# Patient Record
Sex: Female | Born: 1945
Health system: Southern US, Community
[De-identification: ages and names within clinical notes are randomized; demographics above are authoritative.]

## PROBLEM LIST (undated history)

## (undated) DIAGNOSIS — E785 Hyperlipidemia, unspecified: Secondary | ICD-10-CM

## (undated) DIAGNOSIS — E049 Nontoxic goiter, unspecified: Secondary | ICD-10-CM

## (undated) DIAGNOSIS — N189 Chronic kidney disease, unspecified: Secondary | ICD-10-CM

## (undated) DIAGNOSIS — I214 Non-ST elevation (NSTEMI) myocardial infarction: Secondary | ICD-10-CM

## (undated) DIAGNOSIS — M8580 Other specified disorders of bone density and structure, unspecified site: Secondary | ICD-10-CM

## (undated) DIAGNOSIS — E039 Hypothyroidism, unspecified: Secondary | ICD-10-CM

## (undated) DIAGNOSIS — D649 Anemia, unspecified: Secondary | ICD-10-CM

## (undated) DIAGNOSIS — M419 Scoliosis, unspecified: Secondary | ICD-10-CM

## (undated) DIAGNOSIS — M349 Systemic sclerosis, unspecified: Secondary | ICD-10-CM

## (undated) DIAGNOSIS — I509 Heart failure, unspecified: Secondary | ICD-10-CM

## (undated) DIAGNOSIS — I10 Essential (primary) hypertension: Secondary | ICD-10-CM

## (undated) DIAGNOSIS — I5181 Takotsubo syndrome: Secondary | ICD-10-CM

## (undated) DIAGNOSIS — C801 Malignant (primary) neoplasm, unspecified: Secondary | ICD-10-CM

## (undated) DIAGNOSIS — I469 Cardiac arrest, cause unspecified: Secondary | ICD-10-CM

## (undated) HISTORY — DX: Hyperlipidemia, unspecified: E78.5

## (undated) HISTORY — DX: Non-ST elevation (NSTEMI) myocardial infarction: I21.4

## (undated) HISTORY — DX: Essential (primary) hypertension: I10

## (undated) HISTORY — DX: Nontoxic goiter, unspecified: E04.9

## (undated) HISTORY — DX: Malignant (primary) neoplasm, unspecified: C80.1

## (undated) HISTORY — PX: THYROIDECTOMY: SHX17

## (undated) HISTORY — DX: Takotsubo syndrome: I51.81

## (undated) HISTORY — PX: BACK SURGERY: SHX140

## (undated) HISTORY — DX: Systemic sclerosis, unspecified: M34.9

## (undated) HISTORY — PX: TONSILLECTOMY: SUR1361

## (undated) HISTORY — PX: OTHER SURGICAL HISTORY: SHX169

---

## 1999-06-30 ENCOUNTER — Encounter: Admission: RE | Admit: 1999-06-30 | Discharge: 1999-06-30 | Payer: Self-pay | Admitting: Obstetrics and Gynecology

## 1999-06-30 ENCOUNTER — Encounter: Payer: Self-pay | Admitting: Obstetrics and Gynecology

## 2000-08-17 ENCOUNTER — Encounter: Payer: Self-pay | Admitting: Obstetrics and Gynecology

## 2000-08-17 ENCOUNTER — Encounter: Admission: RE | Admit: 2000-08-17 | Discharge: 2000-08-17 | Payer: Self-pay | Admitting: Obstetrics and Gynecology

## 2001-08-14 ENCOUNTER — Other Ambulatory Visit: Admission: RE | Admit: 2001-08-14 | Discharge: 2001-08-14 | Payer: Self-pay | Admitting: Obstetrics and Gynecology

## 2001-10-05 ENCOUNTER — Encounter: Admission: RE | Admit: 2001-10-05 | Discharge: 2001-10-05 | Payer: Self-pay | Admitting: Obstetrics and Gynecology

## 2001-10-05 ENCOUNTER — Encounter: Payer: Self-pay | Admitting: Obstetrics and Gynecology

## 2003-03-05 ENCOUNTER — Other Ambulatory Visit: Admission: RE | Admit: 2003-03-05 | Discharge: 2003-03-05 | Payer: Self-pay | Admitting: Gynecology

## 2003-04-02 ENCOUNTER — Encounter: Admission: RE | Admit: 2003-04-02 | Discharge: 2003-04-02 | Payer: Self-pay | Admitting: Gynecology

## 2004-03-04 ENCOUNTER — Other Ambulatory Visit: Admission: RE | Admit: 2004-03-04 | Discharge: 2004-03-04 | Payer: Self-pay | Admitting: Gynecology

## 2004-04-09 ENCOUNTER — Encounter: Admission: RE | Admit: 2004-04-09 | Discharge: 2004-04-09 | Payer: Self-pay | Admitting: Family Medicine

## 2005-04-12 ENCOUNTER — Encounter: Admission: RE | Admit: 2005-04-12 | Discharge: 2005-04-12 | Payer: Self-pay | Admitting: Gynecology

## 2005-04-12 ENCOUNTER — Other Ambulatory Visit: Admission: RE | Admit: 2005-04-12 | Discharge: 2005-04-12 | Payer: Self-pay | Admitting: Gynecology

## 2006-02-09 ENCOUNTER — Ambulatory Visit: Payer: Self-pay | Admitting: Gastroenterology

## 2006-03-07 ENCOUNTER — Ambulatory Visit: Payer: Self-pay | Admitting: Gastroenterology

## 2006-04-12 ENCOUNTER — Other Ambulatory Visit: Admission: RE | Admit: 2006-04-12 | Discharge: 2006-04-12 | Payer: Self-pay | Admitting: Gynecology

## 2006-04-13 ENCOUNTER — Encounter: Admission: RE | Admit: 2006-04-13 | Discharge: 2006-04-13 | Payer: Self-pay | Admitting: Family Medicine

## 2007-04-16 ENCOUNTER — Other Ambulatory Visit: Admission: RE | Admit: 2007-04-16 | Discharge: 2007-04-16 | Payer: Self-pay | Admitting: Gynecology

## 2009-06-23 ENCOUNTER — Encounter: Admission: RE | Admit: 2009-06-23 | Discharge: 2009-06-23 | Payer: Self-pay | Admitting: Family Medicine

## 2009-06-25 ENCOUNTER — Encounter: Admission: RE | Admit: 2009-06-25 | Discharge: 2009-06-25 | Payer: Self-pay | Admitting: Family Medicine

## 2010-05-29 ENCOUNTER — Other Ambulatory Visit: Payer: Self-pay | Admitting: Family Medicine

## 2010-05-29 DIAGNOSIS — Z1231 Encounter for screening mammogram for malignant neoplasm of breast: Secondary | ICD-10-CM

## 2010-06-15 ENCOUNTER — Other Ambulatory Visit: Payer: Self-pay | Admitting: Family Medicine

## 2010-06-15 DIAGNOSIS — E049 Nontoxic goiter, unspecified: Secondary | ICD-10-CM

## 2010-06-21 ENCOUNTER — Other Ambulatory Visit: Payer: Self-pay

## 2010-06-21 ENCOUNTER — Ambulatory Visit
Admission: RE | Admit: 2010-06-21 | Discharge: 2010-06-21 | Disposition: A | Discharge status: Home / Self Care | Payer: PRIVATE HEALTH INSURANCE | Source: Ambulatory Visit | Attending: Family Medicine | Admitting: Family Medicine

## 2010-06-21 DIAGNOSIS — E049 Nontoxic goiter, unspecified: Secondary | ICD-10-CM

## 2010-07-28 ENCOUNTER — Other Ambulatory Visit: Payer: Self-pay | Admitting: Endocrinology

## 2010-07-28 DIAGNOSIS — E042 Nontoxic multinodular goiter: Secondary | ICD-10-CM

## 2010-07-29 ENCOUNTER — Ambulatory Visit
Admission: RE | Admit: 2010-07-29 | Discharge: 2010-07-29 | Disposition: A | Discharge status: Home / Self Care | Payer: PRIVATE HEALTH INSURANCE | Source: Ambulatory Visit | Attending: Family Medicine | Admitting: Family Medicine

## 2010-07-29 DIAGNOSIS — Z1231 Encounter for screening mammogram for malignant neoplasm of breast: Secondary | ICD-10-CM

## 2010-08-04 ENCOUNTER — Other Ambulatory Visit (HOSPITAL_COMMUNITY)
Admission: RE | Admit: 2010-08-04 | Discharge: 2010-08-04 | Disposition: A | Discharge status: Home / Self Care | Payer: PRIVATE HEALTH INSURANCE | Source: Ambulatory Visit | Attending: Interventional Radiology | Admitting: Interventional Radiology

## 2010-08-04 ENCOUNTER — Other Ambulatory Visit: Payer: Self-pay | Admitting: Interventional Radiology

## 2010-08-04 ENCOUNTER — Ambulatory Visit
Admission: RE | Admit: 2010-08-04 | Discharge: 2010-08-04 | Disposition: A | Discharge status: Home / Self Care | Payer: PRIVATE HEALTH INSURANCE | Source: Ambulatory Visit | Attending: Endocrinology | Admitting: Endocrinology

## 2010-08-04 DIAGNOSIS — E042 Nontoxic multinodular goiter: Secondary | ICD-10-CM

## 2010-08-04 DIAGNOSIS — E049 Nontoxic goiter, unspecified: Secondary | ICD-10-CM | POA: Insufficient documentation

## 2010-08-24 ENCOUNTER — Ambulatory Visit: Payer: Self-pay

## 2010-09-15 ENCOUNTER — Other Ambulatory Visit: Payer: Self-pay | Admitting: Endocrinology

## 2010-09-15 DIAGNOSIS — E049 Nontoxic goiter, unspecified: Secondary | ICD-10-CM

## 2010-09-16 ENCOUNTER — Other Ambulatory Visit: Payer: Self-pay | Admitting: Gynecology

## 2010-09-24 ENCOUNTER — Ambulatory Visit
Admission: RE | Admit: 2010-09-24 | Discharge: 2010-09-24 | Disposition: A | Discharge status: Home / Self Care | Payer: PRIVATE HEALTH INSURANCE | Source: Ambulatory Visit | Attending: Endocrinology | Admitting: Endocrinology

## 2010-09-24 DIAGNOSIS — E049 Nontoxic goiter, unspecified: Secondary | ICD-10-CM

## 2010-10-26 ENCOUNTER — Ambulatory Visit (HOSPITAL_COMMUNITY): Payer: PRIVATE HEALTH INSURANCE

## 2010-10-28 ENCOUNTER — Ambulatory Visit (HOSPITAL_COMMUNITY)
Admission: RE | Admit: 2010-10-28 | Discharge: 2010-10-28 | Disposition: A | Discharge status: Home / Self Care | Payer: PRIVATE HEALTH INSURANCE | Source: Ambulatory Visit | Attending: Internal Medicine | Admitting: Internal Medicine

## 2010-10-28 ENCOUNTER — Encounter (HOSPITAL_COMMUNITY): Payer: PRIVATE HEALTH INSURANCE

## 2010-10-28 DIAGNOSIS — M349 Systemic sclerosis, unspecified: Secondary | ICD-10-CM | POA: Insufficient documentation

## 2010-10-28 DIAGNOSIS — I10 Essential (primary) hypertension: Secondary | ICD-10-CM | POA: Insufficient documentation

## 2010-10-28 DIAGNOSIS — E049 Nontoxic goiter, unspecified: Secondary | ICD-10-CM | POA: Insufficient documentation

## 2010-10-28 DIAGNOSIS — E785 Hyperlipidemia, unspecified: Secondary | ICD-10-CM | POA: Insufficient documentation

## 2010-10-28 HISTORY — PX: TRANSTHORACIC ECHOCARDIOGRAM: SHX275

## 2010-11-02 ENCOUNTER — Other Ambulatory Visit (HOSPITAL_COMMUNITY): Payer: PRIVATE HEALTH INSURANCE

## 2010-11-02 ENCOUNTER — Encounter (HOSPITAL_COMMUNITY): Payer: PRIVATE HEALTH INSURANCE

## 2010-11-08 ENCOUNTER — Encounter (INDEPENDENT_AMBULATORY_CARE_PROVIDER_SITE_OTHER): Payer: Self-pay | Admitting: Surgery

## 2010-11-08 ENCOUNTER — Ambulatory Visit (INDEPENDENT_AMBULATORY_CARE_PROVIDER_SITE_OTHER): Payer: PRIVATE HEALTH INSURANCE | Admitting: Surgery

## 2010-11-08 DIAGNOSIS — E042 Nontoxic multinodular goiter: Secondary | ICD-10-CM | POA: Insufficient documentation

## 2010-11-08 DIAGNOSIS — E049 Nontoxic goiter, unspecified: Secondary | ICD-10-CM | POA: Insufficient documentation

## 2010-11-08 NOTE — Progress Notes (Signed)
HISTORY: Patient is a 65 year old white female. She has had a history of multinodular thyroid goiter for 6 years. It has gradually increased in size. She stopped taking her thyroid hormone suppression of therapy in March 2012. She has had sequential ultrasound scanning showing gradual enlargement of the multinodular goiter. There is a suspicious nodule measuring 2.2 cm in the upper left lobe with calcifications. Previous fine-needle aspiration biopsy has been indeterminate.  Patient has undergone radioactive iodine treatment for hyperthyroidism in 1999 in [place].  Patient has had no prior head or neck surgery. There is no other family history of endocrinopathy. Specifically there is no history of thyroid cancer.  Past Medical History  Diagnosis Date  . Diabetes mellitus   . Thyroid goiter   . Hypertension   . Hyperlipidemia   . Scleroderma    No current outpatient prescriptions on file prior to visit.   Allergies  Allergen Reactions  . Sulfa Drugs Cross Reactors      PERTINENT REVIEW OF SYSTEMS: Patient denies any significant compressive symptoms. She does have discomfort with tightfitting clothing or physical examination. Otherwise she does not denies dysphagia, dyspnea, or globus sensation.   EXAM: HEENT shows her to be normocephalic. Sclerae clear. Pupils equal and reactive. Dentition fair. Mucous membranes are moist. Voice is normal. Neck shows an obvious multinodular thyroid goiter greater on the left than on the right. Palpation reveals a firm large nodules bilaterally and involving the isthmus. There is minor discomfort with compression. There is no anterior or posterior cervical lymphadenopathy appreciated. There are no supraclavicular masses. Chest is clear to auscultation bilaterally without rales rhonchi or wheeze Cardiac exam shows regular rate and rhythm without murmur peripheral pulses are felt Extremities are nontender without edema Neurologically the  patient is alert and oriented. There are no focal deficits. There is no sign of tremor.   IMPRESSION: Multinodular thyroid goiter with mild compressive symptoms. Suspicious nodule left thyroid lobe with calcifications, history of inadequate fine-needle aspiration sampling. History of radioactive iodine treatment for hyperthyroidism.   PLAN: I had a lengthy discussion with the patient regarding options for management. Certainly she could continue with observation and undergo fine-needle aspiration biopsy. At this point however the patient is interested in proceeding with thyroidectomy. We discussed the procedure of total thyroidectomy at length. We discussed potential complications including injury to recurrent laryngeal nerves and parathyroid glands. We discussed the need for lifelong thyroid hormone replacement. We discussed the risk of malignancy has been approximately 15%. We discussed the potential need for radioactive iodine treatment postoperatively. The patient understands. We discussed the hospital stay to be anticipated. We discussed her recovery and return to work.  Patient would like to proceed with total thyroidectomy. We will make arrangements for surgery in the near future.

## 2010-12-08 DIAGNOSIS — I5181 Takotsubo syndrome: Secondary | ICD-10-CM

## 2010-12-08 DIAGNOSIS — I214 Non-ST elevation (NSTEMI) myocardial infarction: Secondary | ICD-10-CM

## 2010-12-08 HISTORY — DX: Non-ST elevation (NSTEMI) myocardial infarction: I21.4

## 2010-12-08 HISTORY — DX: Takotsubo syndrome: I51.81

## 2010-12-21 ENCOUNTER — Other Ambulatory Visit (INDEPENDENT_AMBULATORY_CARE_PROVIDER_SITE_OTHER): Payer: Self-pay | Admitting: Surgery

## 2010-12-21 ENCOUNTER — Encounter (HOSPITAL_COMMUNITY): Payer: PRIVATE HEALTH INSURANCE

## 2010-12-21 ENCOUNTER — Ambulatory Visit (HOSPITAL_COMMUNITY)
Admission: RE | Admit: 2010-12-21 | Discharge: 2010-12-21 | Disposition: A | Discharge status: Home / Self Care | Payer: PRIVATE HEALTH INSURANCE | Source: Ambulatory Visit | Attending: Surgery | Admitting: Surgery

## 2010-12-21 DIAGNOSIS — Z01812 Encounter for preprocedural laboratory examination: Secondary | ICD-10-CM | POA: Insufficient documentation

## 2010-12-21 DIAGNOSIS — Z01810 Encounter for preprocedural cardiovascular examination: Secondary | ICD-10-CM | POA: Insufficient documentation

## 2010-12-21 DIAGNOSIS — R059 Cough, unspecified: Secondary | ICD-10-CM | POA: Insufficient documentation

## 2010-12-21 DIAGNOSIS — E049 Nontoxic goiter, unspecified: Secondary | ICD-10-CM

## 2010-12-21 DIAGNOSIS — R05 Cough: Secondary | ICD-10-CM | POA: Insufficient documentation

## 2010-12-21 DIAGNOSIS — Z01818 Encounter for other preprocedural examination: Secondary | ICD-10-CM | POA: Insufficient documentation

## 2010-12-21 DIAGNOSIS — E041 Nontoxic single thyroid nodule: Secondary | ICD-10-CM | POA: Insufficient documentation

## 2010-12-21 DIAGNOSIS — I10 Essential (primary) hypertension: Secondary | ICD-10-CM | POA: Insufficient documentation

## 2010-12-21 LAB — SURGICAL PCR SCREEN
MRSA, PCR: NEGATIVE
Staphylococcus aureus: NEGATIVE

## 2010-12-21 LAB — BASIC METABOLIC PANEL
BUN: 10 mg/dL (ref 6–23)
CO2: 29 mEq/L (ref 19–32)
Calcium: 9.9 mg/dL (ref 8.4–10.5)
Chloride: 102 mEq/L (ref 96–112)
Creatinine, Ser: 0.77 mg/dL (ref 0.50–1.10)
GFR calc Af Amer: >60 mL/min (ref >60)
GFR calc non Af Amer: >60 mL/min (ref >60)
Glucose, Bld: 105 mg/dL — ABNORMAL HIGH (ref 70–99)
Potassium: 4.6 mEq/L (ref 3.5–5.1)
Sodium: 140 mEq/L (ref 135–145)

## 2010-12-21 LAB — URINALYSIS, ROUTINE W REFLEX MICROSCOPIC
Bilirubin Urine: NEGATIVE
Glucose, UA: NEGATIVE mg/dL
Hgb urine dipstick: NEGATIVE
Ketones, ur: NEGATIVE mg/dL
Leukocytes, UA: NEGATIVE
Nitrite: NEGATIVE
Protein, ur: NEGATIVE mg/dL
Specific Gravity, Urine: 1.006 (ref 1.005–1.030)
Urobilinogen, UA: 0.2 mg/dL (ref 0.0–1.0)
pH: 6.5 (ref 5.0–8.0)

## 2010-12-21 LAB — DIFFERENTIAL
Basophils Absolute: 0 10*3/uL (ref 0.0–0.1)
Basophils Relative: 0 % (ref 0–1)
Eosinophils Absolute: 0.1 10*3/uL (ref 0.0–0.7)
Eosinophils Relative: 1 % (ref 0–5)
Lymphocytes Relative: 9 % — ABNORMAL LOW (ref 12–46)
Lymphs Abs: 0.9 10*3/uL (ref 0.7–4.0)
Monocytes Absolute: 0.7 10*3/uL (ref 0.1–1.0)
Monocytes Relative: 8 % (ref 3–12)
Neutro Abs: 7.8 10*3/uL — ABNORMAL HIGH (ref 1.7–7.7)
Neutrophils Relative %: 82 % — ABNORMAL HIGH (ref 43–77)

## 2010-12-21 LAB — CBC
HCT: 37.9 % (ref 36.0–46.0)
Hemoglobin: 12.2 g/dL (ref 12.0–15.0)
MCH: 29.8 pg (ref 26.0–34.0)
MCHC: 32.2 g/dL (ref 30.0–36.0)
MCV: 92.4 fL (ref 78.0–100.0)
Platelets: 321 10*3/uL (ref 150–400)
RBC: 4.1 MIL/uL (ref 3.87–5.11)
RDW: 13.3 % (ref 11.5–15.5)
WBC: 9.5 10*3/uL (ref 4.0–10.5)

## 2010-12-21 LAB — PROTIME-INR
INR: 0.94 (ref 0.00–1.49)
Prothrombin Time: 12.8 seconds (ref 11.6–15.2)

## 2010-12-22 ENCOUNTER — Telehealth (INDEPENDENT_AMBULATORY_CARE_PROVIDER_SITE_OTHER): Payer: Self-pay | Admitting: General Surgery

## 2010-12-23 ENCOUNTER — Telehealth (INDEPENDENT_AMBULATORY_CARE_PROVIDER_SITE_OTHER): Payer: Self-pay

## 2010-12-23 NOTE — Telephone Encounter (Signed)
Patient on biaxin 500mg  for URI- started 12/21/10 by [facility].  Ok, per Dr. Gerrit Friends to proceed with surgery. RMP

## 2010-12-28 ENCOUNTER — Other Ambulatory Visit (INDEPENDENT_AMBULATORY_CARE_PROVIDER_SITE_OTHER): Payer: Self-pay | Admitting: Surgery

## 2010-12-28 ENCOUNTER — Inpatient Hospital Stay (HOSPITAL_COMMUNITY)
Admission: RE | Admit: 2010-12-28 | Discharge: 2011-01-01 | DRG: 625 | Disposition: A | Discharge status: Home / Self Care | Payer: PRIVATE HEALTH INSURANCE | Source: Ambulatory Visit | Attending: Surgery | Admitting: Surgery

## 2010-12-28 DIAGNOSIS — J811 Chronic pulmonary edema: Secondary | ICD-10-CM | POA: Diagnosis not present

## 2010-12-28 DIAGNOSIS — I214 Non-ST elevation (NSTEMI) myocardial infarction: Secondary | ICD-10-CM | POA: Diagnosis not present

## 2010-12-28 DIAGNOSIS — J9600 Acute respiratory failure, unspecified whether with hypoxia or hypercapnia: Secondary | ICD-10-CM | POA: Diagnosis not present

## 2010-12-28 DIAGNOSIS — Z01818 Encounter for other preprocedural examination: Secondary | ICD-10-CM

## 2010-12-28 DIAGNOSIS — C73 Malignant neoplasm of thyroid gland: Secondary | ICD-10-CM

## 2010-12-28 DIAGNOSIS — E785 Hyperlipidemia, unspecified: Secondary | ICD-10-CM | POA: Diagnosis present

## 2010-12-28 DIAGNOSIS — Z01812 Encounter for preprocedural laboratory examination: Secondary | ICD-10-CM

## 2010-12-28 DIAGNOSIS — R748 Abnormal levels of other serum enzymes: Secondary | ICD-10-CM | POA: Diagnosis not present

## 2010-12-28 DIAGNOSIS — E042 Nontoxic multinodular goiter: Principal | ICD-10-CM | POA: Diagnosis present

## 2010-12-28 DIAGNOSIS — R9431 Abnormal electrocardiogram [ECG] [EKG]: Secondary | ICD-10-CM | POA: Diagnosis not present

## 2010-12-28 DIAGNOSIS — Z888 Allergy status to other drugs, medicaments and biological substances status: Secondary | ICD-10-CM

## 2010-12-28 DIAGNOSIS — I10 Essential (primary) hypertension: Secondary | ICD-10-CM | POA: Diagnosis present

## 2010-12-28 DIAGNOSIS — E119 Type 2 diabetes mellitus without complications: Secondary | ICD-10-CM | POA: Diagnosis present

## 2010-12-28 DIAGNOSIS — Z01810 Encounter for preprocedural cardiovascular examination: Secondary | ICD-10-CM

## 2010-12-28 DIAGNOSIS — E871 Hypo-osmolality and hyponatremia: Secondary | ICD-10-CM | POA: Diagnosis not present

## 2010-12-28 LAB — CALCIUM: Calcium: 8 mg/dL — ABNORMAL LOW (ref 8.4–10.5)

## 2010-12-29 ENCOUNTER — Observation Stay (HOSPITAL_COMMUNITY): Payer: PRIVATE HEALTH INSURANCE

## 2010-12-29 DIAGNOSIS — J9600 Acute respiratory failure, unspecified whether with hypoxia or hypercapnia: Secondary | ICD-10-CM

## 2010-12-29 DIAGNOSIS — J810 Acute pulmonary edema: Secondary | ICD-10-CM

## 2010-12-29 DIAGNOSIS — R079 Chest pain, unspecified: Secondary | ICD-10-CM

## 2010-12-29 LAB — COMPREHENSIVE METABOLIC PANEL
ALT: 26 U/L (ref 0–35)
AST: 38 U/L — ABNORMAL HIGH (ref 0–37)
Albumin: 3.2 g/dL — ABNORMAL LOW (ref 3.5–5.2)
Alkaline Phosphatase: 61 U/L (ref 39–117)
BUN: 13 mg/dL (ref 6–23)
CO2: 29 mEq/L (ref 19–32)
Calcium: 8.9 mg/dL (ref 8.4–10.5)
Chloride: 82 mEq/L — ABNORMAL LOW (ref 96–112)
Creatinine, Ser: 0.67 mg/dL (ref 0.50–1.10)
GFR calc Af Amer: >60 mL/min (ref >60)
GFR calc non Af Amer: >60 mL/min (ref >60)
Glucose, Bld: 152 mg/dL — ABNORMAL HIGH (ref 70–99)
Potassium: 3.5 mEq/L (ref 3.5–5.1)
Sodium: 124 mEq/L — ABNORMAL LOW (ref 135–145)
Total Bilirubin: 0.6 mg/dL (ref 0.3–1.2)
Total Protein: 7 g/dL (ref 6.0–8.3)

## 2010-12-29 LAB — D-DIMER, QUANTITATIVE: D-Dimer, Quant: 2.9 ug/mL-FEU — ABNORMAL HIGH (ref 0.00–0.48)

## 2010-12-29 LAB — BLOOD GAS, ARTERIAL
Acid-Base Excess: 2.1 mmol/L — ABNORMAL HIGH (ref 0.0–2.0)
Bicarbonate: 25.5 mEq/L — ABNORMAL HIGH (ref 20.0–24.0)
Drawn by: 33147
FIO2: 1 %
O2 Saturation: 82 %
Patient temperature: 98.6
TCO2: 23.1 mmol/L (ref 0–100)
pCO2 arterial: 37 mmHg (ref 35.0–45.0)
pH, Arterial: 7.453 — ABNORMAL HIGH (ref 7.350–7.400)
pO2, Arterial: 46.3 mmHg — ABNORMAL LOW (ref 80.0–100.0)

## 2010-12-29 LAB — CBC
HCT: 34.5 % — ABNORMAL LOW (ref 36.0–46.0)
Hemoglobin: 11.9 g/dL — ABNORMAL LOW (ref 12.0–15.0)
MCH: 29.8 pg (ref 26.0–34.0)
MCHC: 34.5 g/dL (ref 30.0–36.0)
MCV: 86.3 fL (ref 78.0–100.0)
Platelets: 391 10*3/uL (ref 150–400)
RBC: 4 MIL/uL (ref 3.87–5.11)
RDW: 12.6 % (ref 11.5–15.5)
WBC: 25 10*3/uL — ABNORMAL HIGH (ref 4.0–10.5)

## 2010-12-29 LAB — CALCIUM
Calcium: 7.8 mg/dL — ABNORMAL LOW (ref 8.4–10.5)
Calcium: 8.6 mg/dL (ref 8.4–10.5)

## 2010-12-29 LAB — CARDIAC PANEL(CRET KIN+CKTOT+MB+TROPI)
CK, MB: 17.8 ng/mL (ref 0.3–4.0)
CK, MB: 12 ng/mL (ref 0.3–4.0)
Relative Index: 4.3 — ABNORMAL HIGH (ref 0.0–2.5)
Relative Index: 3.5 — ABNORMAL HIGH (ref 0.0–2.5)
Total CK: 415 U/L — ABNORMAL HIGH (ref 7–177)
Total CK: 342 U/L — ABNORMAL HIGH (ref 7–177)
Troponin I: 2.47 ng/mL (ref <0.30)
Troponin I: 1.64 ng/mL (ref <0.30)

## 2010-12-29 LAB — PRO B NATRIURETIC PEPTIDE: Pro B Natriuretic peptide (BNP): 17858 pg/mL — ABNORMAL HIGH (ref 0–125)

## 2010-12-29 LAB — MAGNESIUM: Magnesium: 1.4 mg/dL — ABNORMAL LOW (ref 1.5–2.5)

## 2010-12-30 ENCOUNTER — Observation Stay (HOSPITAL_COMMUNITY): Payer: PRIVATE HEALTH INSURANCE

## 2010-12-30 ENCOUNTER — Telehealth: Payer: Self-pay | Admitting: Cardiology

## 2010-12-30 DIAGNOSIS — J9600 Acute respiratory failure, unspecified whether with hypoxia or hypercapnia: Secondary | ICD-10-CM

## 2010-12-30 DIAGNOSIS — J810 Acute pulmonary edema: Secondary | ICD-10-CM

## 2010-12-30 DIAGNOSIS — R079 Chest pain, unspecified: Secondary | ICD-10-CM

## 2010-12-30 DIAGNOSIS — I059 Rheumatic mitral valve disease, unspecified: Secondary | ICD-10-CM

## 2010-12-30 DIAGNOSIS — I214 Non-ST elevation (NSTEMI) myocardial infarction: Secondary | ICD-10-CM

## 2010-12-30 HISTORY — PX: TRANSTHORACIC ECHOCARDIOGRAM: SHX275

## 2010-12-30 LAB — CBC
HCT: 28.2 % — ABNORMAL LOW (ref 36.0–46.0)
Hemoglobin: 9.9 g/dL — ABNORMAL LOW (ref 12.0–15.0)
MCH: 30 pg (ref 26.0–34.0)
MCHC: 35.1 g/dL (ref 30.0–36.0)
MCV: 85.5 fL (ref 78.0–100.0)
Platelets: 280 10*3/uL (ref 150–400)
RBC: 3.3 MIL/uL — ABNORMAL LOW (ref 3.87–5.11)
RDW: 12.7 % (ref 11.5–15.5)
WBC: 16.8 10*3/uL — ABNORMAL HIGH (ref 4.0–10.5)

## 2010-12-30 LAB — BASIC METABOLIC PANEL
BUN: 14 mg/dL (ref 6–23)
CO2: 33 mEq/L — ABNORMAL HIGH (ref 19–32)
Calcium: 8.2 mg/dL — ABNORMAL LOW (ref 8.4–10.5)
Chloride: 82 mEq/L — ABNORMAL LOW (ref 96–112)
Creatinine, Ser: 0.78 mg/dL (ref 0.50–1.10)
GFR calc Af Amer: >60 mL/min (ref >60)
GFR calc non Af Amer: >60 mL/min (ref >60)
Glucose, Bld: 148 mg/dL — ABNORMAL HIGH (ref 70–99)
Potassium: 3.9 mEq/L (ref 3.5–5.1)
Sodium: 123 mEq/L — ABNORMAL LOW (ref 135–145)

## 2010-12-30 LAB — PRO B NATRIURETIC PEPTIDE: Pro B Natriuretic peptide (BNP): 18845 pg/mL — ABNORMAL HIGH (ref 0–125)

## 2010-12-30 LAB — HEPARIN LEVEL (UNFRACTIONATED): Heparin Unfractionated: 0.1 IU/mL — ABNORMAL LOW (ref 0.30–0.70)

## 2010-12-30 NOTE — Op Note (Signed)
Kaitlyn Allen, SUPPA NO.:  [id number]  MEDICAL RECORD NO.:  [id number]  LOCATION:  1308                         FACILITY:  [facility]  PHYSICIAN:  Velora Heckler, MD      DATE OF BIRTH:  Feb 14, 1946  DATE OF PROCEDURE:  12/28/2010                               OPERATIVE REPORT   PREOPERATIVE DIAGNOSES:  Multinodular thyroid goiter with compressive symptoms, history of radioactive iodine exposure.  POSTOPERATIVE DIAGNOSES:  Multinodular thyroid goiter with compressive symptoms, history of radioactive iodine exposure.  PROCEDURE:  Total thyroidectomy.  SURGEON:  Velora Heckler, M.D., FACS  ASSISTANT:  Lodema Pilot, D.O.  ANESTHESIA:  General.  ESTIMATED BLOOD LOSS:  250 mL.  PREPARATION:  ChloraPrep.  COMPLICATIONS:  None.  INDICATIONS:  The patient is a 65 year old white female with a longstanding history of multinodular goiter.  This has gradually increased in size despite thyroid hormone suppressive therapy.  The patient has had previous fine-needle aspiration biopsy, which showed indeterminate cytopathology.  The patient was also treated with radioactive iodine for hyperthyroidism in 1999 in [place]. The patient now comes to surgery on referral from her endocrinologist for thyroidectomy.  BODY OF REPORT:  Procedure was done in OR #6 at the [facility].  The patient was brought to the operating room, placed in supine position on the operating room table.  Following administration of general anesthesia, the patient was positioned and then prepped and draped in the usual strict aseptic fashion.  After ascertaining that, an adequate level of anesthesia been achieved, a Kocher incision was made with a #15 blade.  Dissection was carried into the subcutaneous tissues and through the platysma.  Hemostasis was obtained with the electrocautery.  The thyroid gland was markedly enlarged with a very large left thyroid lobe.  This  causes tracheal deviation to the right.  Strap muscles were mobilized off the surface of the gland and reflected laterally.  There was significant blood loss from superficial vessels on the thyroid due to adherence of the overlying strap muscles.  Middle thyroid vein was divided between medium Ligaclips with the harmonic scalpel.  Using gentle blunt dissection, the left lobe was mobilized.  With some difficulty, the superior pole vessels were visualized and divided individually between medium Ligaclips and also ligated with 2-0 silk ties.  Once the superior pole was mobilized, the gland was delivered out of the left neck and rotated anteriorly.  Lateral dissection was carefully performed in order to preserve the parathyroid glands and avoid injury to the recurrent nerve, which was not well-visualized.  Individual branches of the inferior thyroid artery are divided between small Ligaclips with the harmonic scalpel.  Dissection was carried down to the ligament of Allyson Sabal, which was transected with the electrocautery.  Gland was mobilized onto the anterior trachea.  There was no significant pyramidal lobe identified. Dry pack was placed in the left neck.  Next, we turned our attention to the right thyroid lobe.  Again, strap muscles were mobilized off the surface of the gland to which they are somewhat adherent.  Right lobe was multilobular.  It was also enlarged. It was not enlarged to the degree seen on the left  side.  Venous tributaries were divided between medium Ligaclips with the harmonic scalpel.  Superior pole vessels were dissected out and divided between medium Ligaclips with the harmonic scalpel also ligated with 2-0 silk ties.  Gland was rolled anteriorly.  Dissection was carried on the surface of the gland in order to preserve the parathyroid glands and avoid injury to the underlying recurrent nerve.  Branches of the inferior thyroid artery are divided between Ligaclips with the  harmonic scalpel.  Inferior venous tributaries were ligated with 2-0 silk tie and divided with the harmonic scalpel.  Gland was rolled anteriorly.  It was then excised off the anterior trachea using the electrocautery. Inferior venous tributaries were divided between medium Ligaclips with the harmonic scalpel and the gland was completely removed.  Sutures used to mark the left superior pole.  The entire thyroid gland was submitted to pathology for review.  Neck was irrigated copiously with warm saline bilaterally.  Good hemostasis was achieved bilaterally.  Surgicel was placed in the operative field.  Strap muscles were reapproximated in the midline with interrupted 3-0 Vicryl sutures.  Platysma was closed with interrupted 3- 0 Vicryl sutures.  Skin was closed with a running 4-0 Monocryl subcuticular suture.  Wound was washed and dried and Benzoin and Steri- Strips were applied.  Sterile dressings were applied.  The patient was awakened from anesthesia and brought to the recovery room.  The patient tolerated the procedure well.   Velora Heckler, MD, FACS     TMG/MEDQ  D:  12/28/2010  T:  12/28/2010  Job:  [id number]  cc:   Dorisann Frames, M.D. Fax: [phone]  Electronically Signed by Darnell Level MD on 12/30/2010 02:16:47 PM

## 2010-12-30 NOTE — Telephone Encounter (Signed)
Kaitlyn Allen has questions about the cardiac cath in scheduling.  Dr. Patty Sermons saw pt today during his rounds.  Please call back Kaitlyn Allen at [facility] ICU back regarding pt.  Do not see a chart for this patient.

## 2010-12-30 NOTE — Discharge Summary (Signed)
  NAMEALLEX, MADIA NO.:  [id number]  MEDICAL RECORD NO.:  [id number]  LOCATION:  1240                         FACILITY:  [facility]  PHYSICIAN:  Velora Heckler, MD      DATE OF BIRTH:  09/12/1945  DATE OF ADMISSION:  12/28/2010 DATE OF DISCHARGE:  12/29/2010                              DISCHARGE SUMMARY   REASON FOR ADMISSION:  Multinodular thyroid goiter with compressive symptoms.  BRIEF HISTORY:  The patient is a 65 year old white female with multinodular goiter diagnosed approximately 6 years ago.  She has had progressive gradual enlargement of the gland with development of compressive symptoms.  She has a history of radioactive iodine exposure for hyperthyroidism in 1999.  The patient now comes to Surgery for thyroidectomy.  HOSPITAL COURSE:  The patient was admitted on the Surgical service.  She was taken directly to the operating room, where she underwent total thyroidectomy.  Postoperative course was notable for hypocalcemia. Calcium level was 8.0 on the evening of surgery and 7.8 on the first postoperative day.  She was administered 2 g of intravenous calcium gluconate.  Oral calcium supplements were increased to four times daily. Vitamin D supplements were increased to twice daily.  The patient was prepared for discharge home with warnings regarding symptoms of hypocalcemia.  DISCHARGE PLAN:  The patient will be discharged home today, December 29, 2010, in good condition, tolerating a regular diet, and ambulating independently.  DISCHARGE MEDICATIONS:  Include, 1. Synthroid 75 mcg daily. 2. Vicodin as needed for pain. 3. Calcium carbonate 1200 mg four times daily. 4. Vitamin D 2000 units twice daily.  The patient will be seen back in my office at [facility] in 2-3 weeks.  FINAL DIAGNOSIS:  Multinodular thyroid goiter with compressive symptoms, final pathologic results pending at the time of discharge.  CONDITION AT DISCHARGE:   Good.     Velora Heckler, MD     TMG/MEDQ  D:  12/29/2010  T:  12/29/2010  Job:  [id number]  cc:   Dorisann Frames, M.D. Fax: [phone]  Electronically Signed by Darnell Level MD on 12/30/2010 02:16:50 PM

## 2010-12-30 NOTE — Telephone Encounter (Signed)
Please call her,

## 2010-12-30 NOTE — Telephone Encounter (Signed)
I called her

## 2010-12-31 ENCOUNTER — Ambulatory Visit (HOSPITAL_COMMUNITY)
Admission: AD | Admit: 2010-12-31 | Discharge: 2010-12-31 | Disposition: A | Discharge status: Home / Self Care | Payer: PRIVATE HEALTH INSURANCE | Source: Ambulatory Visit | Attending: Cardiology | Admitting: Cardiology

## 2010-12-31 ENCOUNTER — Ambulatory Visit (HOSPITAL_COMMUNITY): Admission: AD | Admit: 2010-12-31 | Payer: PRIVATE HEALTH INSURANCE | Source: Ambulatory Visit | Admitting: Cardiology

## 2010-12-31 HISTORY — PX: CARDIAC CATHETERIZATION: SHX172

## 2010-12-31 LAB — HEPARIN LEVEL (UNFRACTIONATED)
Heparin Unfractionated: 0.23 IU/mL — ABNORMAL LOW (ref 0.30–0.70)
Heparin Unfractionated: 0.34 IU/mL (ref 0.30–0.70)

## 2010-12-31 LAB — BASIC METABOLIC PANEL
BUN: 13 mg/dL (ref 6–23)
CO2: 31 mEq/L (ref 19–32)
Calcium: 8.6 mg/dL (ref 8.4–10.5)
Chloride: 97 mEq/L (ref 96–112)
Creatinine, Ser: 0.73 mg/dL (ref 0.50–1.10)
GFR calc Af Amer: >60 mL/min (ref >60)
GFR calc non Af Amer: >60 mL/min (ref >60)
Glucose, Bld: 117 mg/dL — ABNORMAL HIGH (ref 70–99)
Potassium: 3.8 mEq/L (ref 3.5–5.1)
Sodium: 135 mEq/L (ref 135–145)

## 2010-12-31 LAB — POCT ACTIVATED CLOTTING TIME: Activated Clotting Time: 111 seconds

## 2010-12-31 LAB — GLUCOSE, CAPILLARY: Glucose-Capillary: 106 mg/dL — ABNORMAL HIGH (ref 70–99)

## 2010-12-31 LAB — PROTIME-INR
INR: 1.06 (ref 0.00–1.49)
Prothrombin Time: 14 seconds (ref 11.6–15.2)

## 2010-12-31 LAB — APTT: aPTT: 60 seconds — ABNORMAL HIGH (ref 24–37)

## 2010-12-31 LAB — PLATELET COUNT: Platelets: 284 10*3/uL (ref 150–400)

## 2011-01-01 DIAGNOSIS — I509 Heart failure, unspecified: Secondary | ICD-10-CM

## 2011-01-01 LAB — CBC
HCT: 29.1 % — ABNORMAL LOW (ref 36.0–46.0)
Hemoglobin: 9.7 g/dL — ABNORMAL LOW (ref 12.0–15.0)
MCH: 30.2 pg (ref 26.0–34.0)
MCHC: 33.3 g/dL (ref 30.0–36.0)
MCV: 90.7 fL (ref 78.0–100.0)
Platelets: 303 10*3/uL (ref 150–400)
RBC: 3.21 MIL/uL — ABNORMAL LOW (ref 3.87–5.11)
RDW: 13.5 % (ref 11.5–15.5)
WBC: 12 10*3/uL — ABNORMAL HIGH (ref 4.0–10.5)

## 2011-01-01 LAB — BASIC METABOLIC PANEL
BUN: 10 mg/dL (ref 6–23)
CO2: 26 mEq/L (ref 19–32)
Calcium: 8.7 mg/dL (ref 8.4–10.5)
Chloride: 102 mEq/L (ref 96–112)
Creatinine, Ser: 0.62 mg/dL (ref 0.50–1.10)
GFR calc Af Amer: >60 mL/min (ref >60)
GFR calc non Af Amer: >60 mL/min (ref >60)
Glucose, Bld: 103 mg/dL — ABNORMAL HIGH (ref 70–99)
Potassium: 4 mEq/L (ref 3.5–5.1)
Sodium: 136 mEq/L (ref 135–145)

## 2011-01-01 NOTE — Progress Notes (Signed)
Quick Note:  These results are acceptable for scheduled surgery. TMG ______ 

## 2011-01-02 ENCOUNTER — Telehealth (INDEPENDENT_AMBULATORY_CARE_PROVIDER_SITE_OTHER): Payer: Self-pay | Admitting: General Surgery

## 2011-01-02 NOTE — Telephone Encounter (Signed)
She is s/p total thyroidectomy by Dr. Gerrit Friends.  She reports having a postop cardiac event and was started on Lopressor and Enalapril.  She stated her blood pressure was running low earlier today but now has recovered to the normal range.  She wonders if she should keep taking the two medications.  I advised her to continue the Lopressor and stop the Enalapril and call her Cardiologist in the morning.

## 2011-01-03 ENCOUNTER — Other Ambulatory Visit: Payer: Self-pay | Admitting: *Deleted

## 2011-01-03 ENCOUNTER — Telehealth: Payer: Self-pay | Admitting: Cardiology

## 2011-01-03 NOTE — Telephone Encounter (Signed)
Agree with plan 

## 2011-01-03 NOTE — Telephone Encounter (Signed)
Has a question regarding her medications and her low BP readings.

## 2011-01-03 NOTE — Telephone Encounter (Signed)
Patient phoned c/o having low blood pressure, running 77/56 and 88/?.  Patient has no symptoms.  Was told at discharge from hospital to double up her enalapril.  Also takes metoprolol 12.5 mg twice daily.  Heart rate 102-103.  Will discontinue enalapril and continue metoprolol.  Keep appointment with Lawson Fiscal on Friday.  Will bring her blood pressure monitor with her to appointment.  Verbalized understanding.

## 2011-01-03 NOTE — Cardiovascular Report (Signed)
  NAMEYAZLEEN, MOLOCK NO.:  [id number]  MEDICAL RECORD NO.:  [id number]  LOCATION:  CATH                         FACILITY:  [facility]  PHYSICIAN:  Kassaundra Hair M. [place], M.D.  DATE OF BIRTH:  [birth year]  DATE OF PROCEDURE:  12/31/2010 DATE OF DISCHARGE:                           CARDIAC CATHETERIZATION   INDICATIONS FOR PROCEDURE:  A 65 year old white female with history of diabetes, hypertension, and scleroderma.  She recently underwent thyroidectomy.  Postoperatively, she went to acute pulmonary edema and had elevation of cardiac enzymes consistent with a non-Q-wave myocardial infarction.  PROCEDURES:  Left heart catheterization, coronary and left ventricular angiography.  ACCESS:  Via right the femoral artery using standard Seldinger technique.  EQUIPMENT:  A 5-French 4-cm right and left Judkins catheter, 5-French pigtail catheter, and 5-French arterial sheath.  MEDICATIONS:  Local anesthesia with 1% Xylocaine, Versed 1 mg IV.  CONTRAST:  90 mL of Omnipaque.  HEMODYNAMIC DATA:  Aortic pressure is 108/69 with a mean of 87 mmHg. Left ventricular pressure is 110 with an EDP of 15 mmHg.  ANGIOGRAPHIC DATA:  The left coronary artery arises and distributes normally.  The left main coronary artery is normal.  The left anterior descending artery is normal.  The left circumflex coronary artery is a large codominant vessel and is normal.  The right coronary artery is normal.  Left ventricular angiography performed in the RAO view demonstrates moderate-to-severe hypokinesis involving the mid-to-distal anterior wall, distal inferior wall, and apex.  Overall, systolic function is moderately reduced with ejection fraction estimated at 40%.  FINAL INTERPRETATION: 1. Normal coronary anatomy. 2. Moderate left ventricular dysfunction.  Pattern of LV dysfunction     would be consistent with possible Takotsubo-related cardiomyopathy.  PLAN:  We will treat  medically.          ______________________________ Liev Brockbank M. [place], M.D.     PMJ/MEDQ  D:  12/31/2010  T:  12/31/2010  Job:  [id number]  cc:   Velora Heckler, MD Dorisann Frames, M.D.  Electronically Signed by Harce Volden [place] M.D. on 01/03/2011 02:15:56 PM

## 2011-01-03 NOTE — Consult Note (Signed)
Kaitlyn Allen, Kaitlyn Allen NO.:  [id number]  MEDICAL RECORD NO.:  [id number]  LOCATION:  1240                         FACILITY:  [facility]  PHYSICIAN:  Fallon Haecker M. [place], M.D.  DATE OF BIRTH:  08-16-1945  DATE OF CONSULTATION:  12/29/2010 DATE OF DISCHARGE:  12/29/2010                                CONSULTATION   HISTORY OF PRESENT ILLNESS:  Mrs. Zamorano is a pleasant 65 year old white female whom I am seeing in consult at the request of Dr. Gerrit Friends for acute pulmonary edema.  The patient was admitted and underwent total thyroidectomy yesterday for a large compressive goiter.  Her surgery was uncomplicated.  During the night, she developed a cough.  This morning, her cough worsened and was associated with acute shortness of breath and chest pain, described as a heavy weight sitting on her chest.  She became hypoxic.  Chest x-ray demonstrated acute pulmonary edema, which was new compared to chest x-ray on August 14th.  The patient wastransferred to the intensive care unit and was given IV Lasix and topical nitrates with significant improvement in her symptomatology.  Of note, the patient did have an echocardiogram on October 19, 2010, which showed mild LVH and ejection fraction of 50-55%.  She has no prior cardiac history, but does have multiple cardiac risk factors including history of diabetes, hypertension, scleroderma, and hyperlipidemia.  PAST MEDICAL HISTORY: 1. Diabetes mellitus. 2. Hypertension. 3. Thyroid goiter. 4. Scleroderma. 5. Hyperlipidemia.  ALLERGIES:  SULFA.  MEDICATIONS:  Prior to admission include, 1. Lisinopril/HCT 5/12.5 mg daily. 2. Simvastatin 40 mg daily. 3. Multivitamin daily. 4. Calcium daily. 5. Vitamin D3 daily.  SOCIAL HISTORY:  The patient has never smoked.  She denies alcohol use. She is seen with her daughter today.  FAMILY HISTORY:  Negative for early coronary disease.  It is noteworthy that patient did have pulmonary function  studies on October 28, 2010, which looked normal.  REVIEW OF SYSTEMS:  She does have chronic scleroderma.  This is followed by Dr. Dierdre Forth.  PHYSICAL EXAMINATION:  GENERAL:  She is a pleasant white female in no acute distress. VITAL SIGNS:  Blood pressure is 122/73.  During her acute episode of respiratory distress, her blood pressure went up to 170/110, pulse is currently 112 and sinus rhythm.  She is afebrile. HEENT:  She is normocephalic, atraumatic.  Her pupils are equal round and reactive.  Sclerae are clear.  Oropharynx is clear with dry mucous membranes. NECK:  Reveals thyroidectomy scar that is new.  She has no significant JVD or bruits. LUNGS:  Reveal coarse rhonchi and rales bilaterally. CARDIAC:  Reveals a tachycardic rhythm without gallop, murmur, rub, or click. ABDOMEN:  Soft and nontender.  She has no masses or bruits. EXTREMITIES:  She has no lower extremity edema.  Her pedal pulses are palpable. SKIN:  Reveals chronic sclerodermic changes, particularly in her hands and feet. NEUROLOGIC:  Intact.  She is alert and oriented x3.  Cranial nerves II- XII are intact.  LABORATORY DATA:  Chest x-ray shows acute pulmonary edema, which is new compared to December 21, 2010.  ECG preoperatively was normal.  Today, her ECG shows sinus tachycardia with diffuse T-wave inversion.  Arterial blood gas showed pH of 7.45, pCO2 of 37, pO2 of 46, and bicarb of 25.  IMPRESSION: 1. Acute pulmonary edema.  Etiology is unclear.  I think this is     unlikely to be acute respiratory distress syndrome and suspect more     of a cardiac cause particularly with her diffuse T-wave changes.     We need to rule out acute coronary syndrome.  I suspect the patient     does have diastolic dysfunction based on her history of     hypertension, diabetes, and scleroderma. 2. Status post thyroidectomy. 3. Hypertension. 4. Diabetes mellitus type 2. 5. Hyperlipidemia. 6. Scleroderma.  PLAN:  We will  continue oxygen support.  The patient will be diuresed with IV Lasix.  We will continue with topical nitrates.  She will be loaded with aspirin.  We would consider anticoagulation if okay with General Surgery.  We will check serial cardiac enzymes as well as ECG and BNP level.  We will repeat an echocardiogram to look for LV dysfunction and regional wall motion changes.          ______________________________ Kalena Mander M. [place], M.D.     PMJ/MEDQ  D:  12/29/2010  T:  12/30/2010  Job:  [id number]  cc:   Velora Heckler, MD [address] [place] [address]  Marjory Lies, M.D. Fax: [phone]  Coralyn Helling, MD [address] [place], [place] [address]  Dr. Sherrye Payor  Electronically Signed by Mykenna Viele [place] M.D. on 01/03/2011 02:16:01 PM

## 2011-01-04 ENCOUNTER — Encounter: Payer: Self-pay | Admitting: Nurse Practitioner

## 2011-01-07 ENCOUNTER — Encounter: Payer: Self-pay | Admitting: Nurse Practitioner

## 2011-01-07 ENCOUNTER — Ambulatory Visit (INDEPENDENT_AMBULATORY_CARE_PROVIDER_SITE_OTHER): Payer: PRIVATE HEALTH INSURANCE | Admitting: Nurse Practitioner

## 2011-01-07 ENCOUNTER — Telehealth: Payer: Self-pay | Admitting: *Deleted

## 2011-01-07 VITALS — BP 102/70 | HR 90 | Ht 65.5 in | Wt 123.8 lb

## 2011-01-07 DIAGNOSIS — R7302 Impaired glucose tolerance (oral): Secondary | ICD-10-CM

## 2011-01-07 DIAGNOSIS — R0609 Other forms of dyspnea: Secondary | ICD-10-CM

## 2011-01-07 DIAGNOSIS — D649 Anemia, unspecified: Secondary | ICD-10-CM

## 2011-01-07 DIAGNOSIS — R7309 Other abnormal glucose: Secondary | ICD-10-CM

## 2011-01-07 DIAGNOSIS — R0600 Dyspnea, unspecified: Secondary | ICD-10-CM

## 2011-01-07 DIAGNOSIS — E042 Nontoxic multinodular goiter: Secondary | ICD-10-CM

## 2011-01-07 DIAGNOSIS — I5181 Takotsubo syndrome: Secondary | ICD-10-CM

## 2011-01-07 LAB — BASIC METABOLIC PANEL
BUN: 12 mg/dL (ref 6–23)
CO2: 27 mEq/L (ref 19–32)
Calcium: 8.2 mg/dL — ABNORMAL LOW (ref 8.4–10.5)
Chloride: 109 mEq/L (ref 96–112)
Creatinine, Ser: 0.7 mg/dL (ref 0.4–1.2)
GFR: 86.23 mL/min (ref >60.00)
Glucose, Bld: 93 mg/dL (ref 70–99)
Potassium: 5 mEq/L (ref 3.5–5.1)
Sodium: 144 mEq/L (ref 135–145)

## 2011-01-07 LAB — CBC WITH DIFFERENTIAL/PLATELET
Basophils Absolute: 0.1 10*3/uL (ref 0.0–0.1)
Basophils Relative: 1.2 % (ref 0.0–3.0)
Eosinophils Absolute: 0.1 10*3/uL (ref 0.0–0.7)
Eosinophils Relative: 1.9 % (ref 0.0–5.0)
HCT: 30.1 % — ABNORMAL LOW (ref 36.0–46.0)
Hemoglobin: 10 g/dL — ABNORMAL LOW (ref 12.0–15.0)
Lymphocytes Relative: 25.2 % (ref 12.0–46.0)
Lymphs Abs: 1.3 10*3/uL (ref 0.7–4.0)
MCHC: 33.2 g/dL (ref 30.0–36.0)
MCV: 91.8 fl (ref 78.0–100.0)
Monocytes Absolute: 0.8 10*3/uL (ref 0.1–1.0)
Monocytes Relative: 15.5 % — ABNORMAL HIGH (ref 3.0–12.0)
Neutro Abs: 2.9 10*3/uL (ref 1.4–7.7)
Neutrophils Relative %: 56.2 % (ref 43.0–77.0)
Platelets: 475 10*3/uL — ABNORMAL HIGH (ref 150.0–400.0)
RBC: 3.29 Mil/uL — ABNORMAL LOW (ref 3.87–5.11)
RDW: 13.9 % (ref 11.5–14.6)
WBC: 5.1 10*3/uL (ref 4.5–10.5)

## 2011-01-07 LAB — BRAIN NATRIURETIC PEPTIDE: Pro B Natriuretic peptide (BNP): 170 pg/mL — ABNORMAL HIGH (ref 0.0–100.0)

## 2011-01-07 LAB — HEMOGLOBIN A1C: Hgb A1c MFr Bld: 6.2 % (ref 4.6–6.5)

## 2011-01-07 MED ORDER — LISINOPRIL 5 MG PO TABS: 5.0000 mg | ORAL_TABLET | Freq: Every day | ORAL | Status: DC

## 2011-01-07 NOTE — Patient Instructions (Signed)
We are going to add Lisinopril to your medicines. This is 5 mg per day. Continue to monitor your blood pressure at home Continue with your other current medicines. Weigh yourself each morning and record. Call for a weight gain of 3 pounds in 24 hours. Limit sodium intake.  I will see you in 2 weeks OK to drive Hold off on your beach trip for now.

## 2011-01-07 NOTE — Assessment & Plan Note (Addendum)
She has normal coronaries per cath. EF is 40%. She is doing well. Seems very well compensated at this time. Will try her back on just plain Lisinopril at 5 mg. She will continue to monitor her blood pressure. She will need a repeat echo in a few weeks. I will see her back in 2 weeks. We reviewed diet, CHF measures and activity.Labs are checked today.  Patient is agreeable to this plan and will call if any problems develop in the interim.

## 2011-01-07 NOTE — Assessment & Plan Note (Signed)
Will recheck CBC today to reevaluate.

## 2011-01-07 NOTE — Assessment & Plan Note (Signed)
A1C is checked today.

## 2011-01-07 NOTE — Progress Notes (Signed)
Kaitlyn Allen Date of Birth: [birth year]   History of Present Illness: Kaitlyn Allen is seen back today for her post hospital visit. She is seen for Dr. [place]. She has had recent thyroidectomy. She had positive enzymes with acute pulmonary edema post op. Cath was performed. Her coronaries were normal. She has moderate LV dysfunction and has probable Takotsubo syndrome. She was started on metoprolol and told to double up her enalapril/hct. This caused marked hypotension. She is now off her ACE combo. She is not short of breath. No swelling. She has some cough. No chest pain. She has lots of questions but overall is doing well.   Current Outpatient Prescriptions on File Prior to Visit  Medication Sig Dispense Refill  . Calcium Carbonate (CALCIUM 500 PO) Take by mouth 4 (four) times daily.       . Cholecalciferol (VITAMIN D-3 PO) Take 2,000 Units by mouth 2 (two) times daily.        . metoprolol tartrate (LOPRESSOR) 12.5 mg TABS Take 12.5 mg by mouth 2 (two) times daily.        . Multiple Vitamins-Minerals (MULTIVITAMIN WITH MINERALS) tablet Take 1 tablet by mouth daily.        . simvastatin (ZOCOR) 40 MG tablet Take 40 mg by mouth at bedtime.        . vitamin E 100 UNIT capsule Take 100 Units by mouth daily.          Allergies  Allergen Reactions  . Sulfa Drugs Cross Reactors     Past Medical History  Diagnosis Date  . Diabetes mellitus   . Thyroid goiter     s/p thyroidectomy  . Hypertension   . Hyperlipidemia   . Scleroderma   . NSTEMI (non-ST elevated myocardial infarction) August 2012    Normal coronaries per cath with moderate LV dysfunction. EF 40%  . Takotsubo cardiomyopathy August 2012    Past Surgical History  Procedure Date  . Radioactiveiodine     thyrotoxicosis 1999  . Thyroidectomy   . Cardiac catheterization 12/31/10    Left ventricular angiography performed in the RAO view demonstrates  moderate-to-severe hypokinesis involving the mid-to-distal anterior  wall,  distal inferior wall, and apex.  Overall, systolic function is  moderately reduced with ejection fraction estimated at 40%.   . Transthoracic echocardiogram 12/30/2010    Left ventricle: LVEF is approximately 25 to 30% with hypokinesis  of the distal inferoseptal, mid/distal lateral, mid/distal   anterior, distal inferior, mid/distal mid/distal posterior and   apical walls  . Transthoracic echocardiogram 10/28/2010    Left ventricle: The cavity size was normal. There was mild concentric hypertrophy. Systolic function was lower limits of normal. The estimated ejection fraction was in the range of 50% to 55%.     History  Smoking status  . Never Smoker   Smokeless tobacco  . Not on file    History  Alcohol Use No    Family History  Problem Relation Age of Onset  . Breast cancer Mother   . Arthritis Sister   . Heart failure Father     Review of Systems: The review of systems is positive for slight post op cough. No SOB, PND or orthopnea or edema.  All other systems were reviewed and are negative.  Physical Exam: BP 102/70  Pulse 90  Ht 5' 5.5" (1.664 m)  Wt 123 lb 12.8 oz (56.155 kg)  BMI 20.29 kg/m2  SpO2 98% Patient is very pleasant and in no acute  distress. Skin is warm and dry. Color is normal.  HEENT is unremarkable. Normocephalic/atraumatic. PERRL. Sclera are nonicteric. Neck is supple. No masses. No JVD. Lungs are clear. Cardiac exam shows a regular rate and rhythm. No S3. Abdomen is soft. Extremities are without edema. Gait and ROM are intact. No gross neurologic deficits noted.   LABORATORY DATA:   Assessment / Plan:

## 2011-01-07 NOTE — Assessment & Plan Note (Signed)
She is post op thyroidectomy.

## 2011-01-07 NOTE — Telephone Encounter (Signed)
msg of results and to decrease [employer] list read over phone. Office number left for questions or concerns, labs added for next visit.Alfonso Ramus RN

## 2011-01-18 ENCOUNTER — Other Ambulatory Visit: Payer: Self-pay | Admitting: Nurse Practitioner

## 2011-01-18 DIAGNOSIS — I5181 Takotsubo syndrome: Secondary | ICD-10-CM

## 2011-01-20 ENCOUNTER — Encounter: Payer: Self-pay | Admitting: Nurse Practitioner

## 2011-01-20 ENCOUNTER — Ambulatory Visit (INDEPENDENT_AMBULATORY_CARE_PROVIDER_SITE_OTHER): Payer: PRIVATE HEALTH INSURANCE | Admitting: Nurse Practitioner

## 2011-01-20 ENCOUNTER — Telehealth (INDEPENDENT_AMBULATORY_CARE_PROVIDER_SITE_OTHER): Payer: Self-pay | Admitting: Surgery

## 2011-01-20 ENCOUNTER — Other Ambulatory Visit (INDEPENDENT_AMBULATORY_CARE_PROVIDER_SITE_OTHER): Payer: PRIVATE HEALTH INSURANCE | Admitting: *Deleted

## 2011-01-20 ENCOUNTER — Other Ambulatory Visit: Payer: Self-pay | Admitting: Nurse Practitioner

## 2011-01-20 DIAGNOSIS — D649 Anemia, unspecified: Secondary | ICD-10-CM

## 2011-01-20 DIAGNOSIS — I5020 Unspecified systolic (congestive) heart failure: Secondary | ICD-10-CM

## 2011-01-20 DIAGNOSIS — I5181 Takotsubo syndrome: Secondary | ICD-10-CM

## 2011-01-20 LAB — BASIC METABOLIC PANEL
BUN: 10 mg/dL (ref 6–23)
CO2: 29 mEq/L (ref 19–32)
Calcium: 8.5 mg/dL (ref 8.4–10.5)
Chloride: 108 mEq/L (ref 96–112)
Creatinine, Ser: 0.8 mg/dL (ref 0.4–1.2)
GFR: 81.01 mL/min (ref >60.00)
Glucose, Bld: 93 mg/dL (ref 70–99)
Potassium: 4.9 mEq/L (ref 3.5–5.1)
Sodium: 143 mEq/L (ref 135–145)

## 2011-01-20 LAB — CBC WITH DIFFERENTIAL/PLATELET
Basophils Absolute: 0.1 10*3/uL (ref 0.0–0.1)
Basophils Relative: 1 % (ref 0.0–3.0)
Eosinophils Absolute: 0.1 10*3/uL (ref 0.0–0.7)
Eosinophils Relative: 2.5 % (ref 0.0–5.0)
HCT: 32.6 % — ABNORMAL LOW (ref 36.0–46.0)
Hemoglobin: 10.7 g/dL — ABNORMAL LOW (ref 12.0–15.0)
Lymphocytes Relative: 27.6 % (ref 12.0–46.0)
Lymphs Abs: 1.5 10*3/uL (ref 0.7–4.0)
MCHC: 32.9 g/dL (ref 30.0–36.0)
MCV: 92.9 fl (ref 78.0–100.0)
Monocytes Absolute: 0.5 10*3/uL (ref 0.1–1.0)
Monocytes Relative: 9.6 % (ref 3.0–12.0)
Neutro Abs: 3.3 10*3/uL (ref 1.4–7.7)
Neutrophils Relative %: 59.3 % (ref 43.0–77.0)
Platelets: 344 10*3/uL (ref 150.0–400.0)
RBC: 3.51 Mil/uL — ABNORMAL LOW (ref 3.87–5.11)
RDW: 14.1 % (ref 11.5–14.6)
WBC: 5.6 10*3/uL (ref 4.5–10.5)

## 2011-01-20 NOTE — Assessment & Plan Note (Signed)
She looks great. She is doing very well. She may return to work. Follow up chemistries and CBC are checked today. She will see Dr. Swaziland in 2 months and we will repeat her echo just prior to that visit. She will continue to monitor her blood pressure and heart rate at home. Patient is agreeable to this plan and will call if any problems develop in the interim.

## 2011-01-20 NOTE — Assessment & Plan Note (Signed)
Will recheck today.

## 2011-01-20 NOTE — Progress Notes (Signed)
Ocie Bob Date of Birth: 10-08-1945   History of Present Illness: Ms. Mukherjee is seen today for her 6 month visit. She is seen for Dr. Swaziland. She has had prior thyroidectomy complicated by pulmonary edema. Cath was performed and showed normal coronaries. EF was 40% per cath and 25 to 30% per echo. She is on low dose ACE and beta blocker. Blood pressures are in the 90 systolic's at home. Heart rate has come down. She feels "wonderful". No symptoms whatsoever. Not dizzy. She is anxious to return to work. No shortness of breath, swelling, or weight changes. Her hemoglobin was only 10 at her last visit. We will recheck that today. Her BNP came down very nicely. She has not had any of her medicines today.   Current Outpatient Prescriptions on File Prior to Visit  Medication Sig Dispense Refill  . Calcium Carbonate (CALCIUM 500 PO) Take 1,000 mg by mouth 4 (four) times daily.       . Cholecalciferol (VITAMIN D-3 PO) Take 4,000 Units by mouth 2 (two) times daily.       Marland Kitchen levothyroxine (SYNTHROID, LEVOTHROID) 75 MCG tablet Take 75 mcg by mouth daily.        Marland Kitchen lisinopril (PRINIVIL,ZESTRIL) 5 MG tablet Take 1 tablet (5 mg total) by mouth daily.  30 tablet  11  . metoprolol tartrate (LOPRESSOR) 12.5 mg TABS Take 12.5 mg by mouth 2 (two) times daily.        . Multiple Vitamins-Minerals (MULTIVITAMIN WITH MINERALS) tablet Take 1 tablet by mouth daily.        . simvastatin (ZOCOR) 40 MG tablet Take 40 mg by mouth at bedtime.        . vitamin E 100 UNIT capsule Take 100 Units by mouth daily.          Allergies  Allergen Reactions  . Sulfa Drugs Cross Reactors     Past Medical History  Diagnosis Date  . Diabetes mellitus   . Thyroid goiter     s/p thyroidectomy  . Hypertension   . Hyperlipidemia   . Scleroderma   . NSTEMI (non-ST elevated myocardial infarction) August 2012    Normal coronaries per cath with moderate LV dysfunction. EF 40%  . Takotsubo cardiomyopathy August 2012     Past Surgical History  Procedure Date  . Radioactiveiodine     thyrotoxicosis 1999  . Thyroidectomy   . Cardiac catheterization 12/31/10    Left ventricular angiography performed in the RAO view demonstrates  moderate-to-severe hypokinesis involving the mid-to-distal anterior  wall, distal inferior wall, and apex.  Overall, systolic function is  moderately reduced with ejection fraction estimated at 40%.   . Transthoracic echocardiogram 12/30/2010    Left ventricle: LVEF is approximately 25 to 30% with hypokinesis  of the distal inferoseptal, mid/distal lateral, mid/distal   anterior, distal inferior, mid/distal mid/distal posterior and   apical walls  . Transthoracic echocardiogram 10/28/2010    Left ventricle: The cavity size was normal. There was mild concentric hypertrophy. Systolic function was lower limits of normal. The estimated ejection fraction was in the range of 50% to 55%.     History  Smoking status  . Never Smoker   Smokeless tobacco  . Not on file    History  Alcohol Use No    Family History  Problem Relation Age of Onset  . Breast cancer Mother   . Arthritis Sister   . Heart failure Father     Review of Systems: The  review of systems is as above.  All other systems were reviewed and are negative.  Physical Exam: BP 134/86  Pulse 80  Ht 5' 5.5" (1.664 m)  Wt 127 lb 9.6 oz (57.879 kg)  BMI 20.91 kg/m2 Patient is very pleasant and in no acute distress. Skin is warm and very dry. Color is normal.  HEENT is unremarkable. Normocephalic/atraumatic. PERRL. Sclera are nonicteric. Neck is supple. No masses. No JVD. Lungs are clear. Cardiac exam shows a regular rate and rhythm. Abdomen is soft. Extremities are without edema. Gait and ROM are intact. No gross neurologic deficits noted.   LABORATORY DATA: Pending   Assessment / Plan:

## 2011-01-20 NOTE — Patient Instructions (Signed)
Stay on your current medicines We will see you back in 2 months. You will have a repeat ultrasound prior to your next visit. You may return to work on Monday with no restrictions You may return to [employer]

## 2011-01-25 ENCOUNTER — Telehealth: Payer: Self-pay | Admitting: Cardiology

## 2011-01-25 NOTE — Telephone Encounter (Signed)
Lm that she can call [phone] to reschedule her Echo schedule for Nov.

## 2011-01-25 NOTE — Telephone Encounter (Signed)
Pt would like to reschedule her ECHO appt for 03/10/11.  Please call pt at work or home # to reschedule.

## 2011-01-26 ENCOUNTER — Ambulatory Visit (INDEPENDENT_AMBULATORY_CARE_PROVIDER_SITE_OTHER): Payer: PRIVATE HEALTH INSURANCE | Admitting: Surgery

## 2011-01-26 ENCOUNTER — Encounter (INDEPENDENT_AMBULATORY_CARE_PROVIDER_SITE_OTHER): Payer: Self-pay

## 2011-01-26 VITALS — BP 124/86 | HR 70 | Temp 97.6°F | Resp 16 | Ht 65.5 in | Wt 127.0 lb

## 2011-01-26 DIAGNOSIS — C73 Malignant neoplasm of thyroid gland: Secondary | ICD-10-CM

## 2011-01-26 NOTE — Patient Instructions (Signed)
  COCOA BUTTER & VITAMIN E CREAM  ([employer] or other brand)  Apply cocoa butter/vitamin E cream to your incision 2 - 3 times daily.  Massage cream into incision for one minute with each application.  Use sunscreen (50 SPF or higher) for first 6 months after surgery.  You may substitute Mederma or other scar reducing creams as desired.   

## 2011-01-26 NOTE — Progress Notes (Signed)
Visit Diagnoses: 1. Thyroid cancer, follicular variant of papillary carcinoma, T1a, Nx, Mx     HISTORY: Patient returns for her first postoperative visit having undergone total thyroidectomy. Past she showed a follicular variant of papillary thyroid carcinoma measuring less than 1 cm in size. Patient has been seen by her endocrinologist and is currently taking Synthroid 100 mcg daily. She anticipates doing radioactive iodine treatment in January 2013.  Patient has been followed closely by cardiology due to a myocardial infarction following surgery.   EXAM: Surgical wound is well healed. Good cosmetic result. No sign of infection. Voice quality is normal.   IMPRESSION: #1 follicular variant papillary thyroid carcinoma, status post total thyroidectomy #2 surgical hypothyroidism #3 recent myocardial infarction   PLAN: Patient will begin applying topical creams to her incision. Her followup serum calcium level was normal at 8.5. She will begin decreasing her calcium supplements. Patient will return to see me for a wound check in 6 weeks.   Velora Heckler, MD, FACS General & Endocrine Surgery [facility]

## 2011-01-27 ENCOUNTER — Telehealth: Payer: Self-pay | Admitting: *Deleted

## 2011-01-27 DIAGNOSIS — D649 Anemia, unspecified: Secondary | ICD-10-CM

## 2011-01-27 NOTE — Telephone Encounter (Signed)
Message copied by Lorayne Bender on Thu Jan 27, 2011  4:14 PM ------      Message from: Rosalio Macadamia      Created: Fri Jan 21, 2011  7:47 AM       Ok to report. Labs are satisfactory. CBC shows hemoglobin coming up. Will send copy of labs to Dr. Gerrit Friends. Recheck BMET and CBC on return visit.

## 2011-01-27 NOTE — Telephone Encounter (Signed)
Lm w/lab results. Will repeat CBC and Bmet at OV in Nov

## 2011-02-28 ENCOUNTER — Other Ambulatory Visit (INDEPENDENT_AMBULATORY_CARE_PROVIDER_SITE_OTHER): Payer: Self-pay | Admitting: Surgery

## 2011-03-10 ENCOUNTER — Other Ambulatory Visit (HOSPITAL_COMMUNITY): Payer: PRIVATE HEALTH INSURANCE | Admitting: Radiology

## 2011-03-14 ENCOUNTER — Ambulatory Visit (INDEPENDENT_AMBULATORY_CARE_PROVIDER_SITE_OTHER): Payer: PRIVATE HEALTH INSURANCE | Admitting: Surgery

## 2011-03-14 ENCOUNTER — Encounter (INDEPENDENT_AMBULATORY_CARE_PROVIDER_SITE_OTHER): Payer: Self-pay | Admitting: Surgery

## 2011-03-14 VITALS — BP 132/88 | HR 76 | Temp 98.6°F | Resp 16 | Ht 65.5 in | Wt 126.6 lb

## 2011-03-14 DIAGNOSIS — C73 Malignant neoplasm of thyroid gland: Secondary | ICD-10-CM

## 2011-03-14 NOTE — Progress Notes (Signed)
Visit Diagnoses: 1. Thyroid cancer, follicular variant of papillary carcinoma, T1a, Nx, Mx     HISTORY: Patient is a 65 year old white female who underwent total thyroidectomy for follicular variant of papillary thyroid carcinoma. She is currently taking Synthroid 100 mcg daily. She has had an extensive postoperative cardiac workup. She has been seen by her endocrinologist. She is planning radioactive iodine treatment in January 2013.   EXAM: Surgical wound is nicely healed. No seroma. No sign of infection. Cosmetic result is good. His quality is normal. No palpable masses. No lymphadenopathy.   IMPRESSION: #1 follicular variant papillary thyroid carcinoma, status post total thyroidectomy #2 surgical hypothyroidism #3 cardiomyopathy and recent myocardial infarction   PLAN: Patient will continue close followup with her cardiologist. She continues to be followed by her endocrinologist and will have her radioactive iodine treatment scheduled after the first of the year. She will return to see me for a scheduled followup in 6 months.   Velora Heckler, MD, FACS General & Endocrine Surgery [facility]

## 2011-03-14 NOTE — Patient Instructions (Signed)
  COCOA BUTTER & VITAMIN E CREAM  ([employer] or other brand)  Apply cocoa butter/vitamin E cream to your incision 2 - 3 times daily.  Massage cream into incision for one minute with each application.  Use sunscreen (50 SPF or higher) for first 6 months after surgery.  You may substitute Mederma or other scar reducing creams as desired.   

## 2011-03-15 ENCOUNTER — Ambulatory Visit (HOSPITAL_COMMUNITY): Payer: PRIVATE HEALTH INSURANCE | Attending: Internal Medicine | Admitting: Radiology

## 2011-03-15 DIAGNOSIS — E785 Hyperlipidemia, unspecified: Secondary | ICD-10-CM | POA: Insufficient documentation

## 2011-03-15 DIAGNOSIS — I519 Heart disease, unspecified: Secondary | ICD-10-CM | POA: Insufficient documentation

## 2011-03-15 DIAGNOSIS — I059 Rheumatic mitral valve disease, unspecified: Secondary | ICD-10-CM | POA: Insufficient documentation

## 2011-03-15 DIAGNOSIS — I509 Heart failure, unspecified: Secondary | ICD-10-CM

## 2011-03-15 DIAGNOSIS — I079 Rheumatic tricuspid valve disease, unspecified: Secondary | ICD-10-CM | POA: Insufficient documentation

## 2011-03-15 DIAGNOSIS — I10 Essential (primary) hypertension: Secondary | ICD-10-CM | POA: Insufficient documentation

## 2011-03-16 NOTE — Progress Notes (Signed)
lm

## 2011-03-17 ENCOUNTER — Telehealth: Payer: Self-pay | Admitting: *Deleted

## 2011-03-17 ENCOUNTER — Telehealth: Payer: Self-pay | Admitting: Cardiology

## 2011-03-17 NOTE — Telephone Encounter (Signed)
Pt returning your call. She had some more questions about meds please call

## 2011-03-17 NOTE — Telephone Encounter (Signed)
lm

## 2011-03-17 NOTE — Telephone Encounter (Signed)
New message:  Pt has a few questions to ask about her Echo results and does she need to take her current meds and keep appt with f/u with Dr. Swaziland?  Please call and advise.

## 2011-03-17 NOTE — Telephone Encounter (Signed)
Lm w/ echo results. If has questions call back. Will see back in 6 mo.

## 2011-03-17 NOTE — Telephone Encounter (Signed)
Lm that she should keep her app on 11/19 w/labs to follow up on Echo.

## 2011-03-18 ENCOUNTER — Telehealth: Payer: Self-pay | Admitting: Cardiology

## 2011-03-18 NOTE — Telephone Encounter (Signed)
1) Pt had labs done in October at her place of employment and wants to know if she still needs labs done in Nov.?  She will fax the results on Monday.  Also wants to know if she needs to f/u in Nov since her echo was normal and she has the results?  2) Does she need to stay on the Lisinopril and Metoprolol?

## 2011-03-18 NOTE — Telephone Encounter (Signed)
We can cancel labs for November. She needs to stay on meds until we see her back and then we'll see about stopping them.  Sandor Arboleda [place]and MD

## 2011-03-18 NOTE — Telephone Encounter (Signed)
N/A.  LMTC. 

## 2011-03-18 NOTE — Telephone Encounter (Signed)
Pt has been talking to Synetta Fail about some things and she has further questions and wants to talk someone because she has more questions

## 2011-03-21 NOTE — Telephone Encounter (Signed)
Called wanting to know if she still needed to keep app on 11/19 and could she stop Lisinopril and Metoprolol. Per Dr. Swaziland advised to stay on both medications and to keep app. He will decide then if he will continue medications. She will get a copy of her lab work done at her company and bring that with her to app.

## 2011-03-22 NOTE — Telephone Encounter (Signed)
F/up pt returned your call.

## 2011-03-22 NOTE — Telephone Encounter (Signed)
Pt was notified.  

## 2011-03-22 NOTE — Telephone Encounter (Signed)
N/A.  LMTC. 

## 2011-03-28 ENCOUNTER — Encounter: Payer: Self-pay | Admitting: Cardiology

## 2011-03-28 ENCOUNTER — Other Ambulatory Visit: Payer: PRIVATE HEALTH INSURANCE | Admitting: *Deleted

## 2011-03-28 ENCOUNTER — Ambulatory Visit (INDEPENDENT_AMBULATORY_CARE_PROVIDER_SITE_OTHER): Payer: PRIVATE HEALTH INSURANCE | Admitting: Cardiology

## 2011-03-28 VITALS — BP 124/77 | HR 90 | Ht 65.0 in | Wt 127.0 lb

## 2011-03-28 DIAGNOSIS — I5181 Takotsubo syndrome: Secondary | ICD-10-CM

## 2011-03-28 DIAGNOSIS — I10 Essential (primary) hypertension: Secondary | ICD-10-CM

## 2011-03-28 DIAGNOSIS — I509 Heart failure, unspecified: Secondary | ICD-10-CM

## 2011-03-28 NOTE — Patient Instructions (Signed)
Discontinue metoprolol.   Continue lisinopril long term for high blood pressure.  I will see you again in 4 months.

## 2011-03-28 NOTE — Assessment & Plan Note (Signed)
Recent echocardiogram showed almost complete recovery of her left ventricular function with ejection fraction of 55%. I recommended discontinuing her beta blocker therapy. We'll continue on her ACE inhibitor long-term since she was already on this previously for hypertension control. I will plan a followup again in 4 months.

## 2011-03-28 NOTE — Progress Notes (Signed)
Kaitlyn Allen Date of Birth: 08-06-1945   History of Present Illness: Kaitlyn Allen is seen today for folloow up visit.  She has had prior thyroidectomy complicated by pulmonary edema. Cath was performed and showed normal coronaries. EF was 40% per cath and 25 to 30% per echo. Findings consistent with Takosubo cardiomyopathy.She is on low dose ACE and beta blocker.  Heart rate has come down. She feels "wonderful". No symptoms whatsoever. Not dizzy. She has  returned to work. No shortness of breath, swelling, or weight changes. Her hemoglobin was only 10 at her last visit.    Current Outpatient Prescriptions on File Prior to Visit  Medication Sig Dispense Refill  . Calcium Carbonate (CALCIUM 500 PO) Take 1,000 mg by mouth 4 (four) times daily.       . Cholecalciferol (VITAMIN D-3 PO) Take 4,000 Units by mouth 2 (two) times daily.       Marland Kitchen levothyroxine (SYNTHROID, LEVOTHROID) 100 MCG tablet daily.      Marland Kitchen lisinopril (PRINIVIL,ZESTRIL) 5 MG tablet Take 1 tablet (5 mg total) by mouth daily.  30 tablet  11  . metoprolol tartrate (LOPRESSOR) 12.5 mg TABS Take 12.5 mg by mouth 2 (two) times daily.        . Multiple Vitamins-Minerals (MULTIVITAMIN WITH MINERALS) tablet Take 1 tablet by mouth daily.        . simvastatin (ZOCOR) 40 MG tablet Take 40 mg by mouth at bedtime.        . vitamin E 100 UNIT capsule Take 100 Units by mouth daily.          Allergies  Allergen Reactions  . Sulfa Drugs Cross Reactors Hives    Past Medical History  Diagnosis Date  . Diabetes mellitus   . Thyroid goiter     s/p thyroidectomy  . Hypertension   . Hyperlipidemia   . Scleroderma   . NSTEMI (non-ST elevated myocardial infarction) August 2012    Normal coronaries per cath with moderate LV dysfunction. EF 40%  . Takotsubo cardiomyopathy August 2012  . Cancer     thyroid    Past Surgical History  Procedure Date  . Radioactiveiodine     thyrotoxicosis 1999  . Thyroidectomy     12/28/2010  . Cardiac  catheterization 12/31/10    Left ventricular angiography performed in the RAO view demonstrates  moderate-to-severe hypokinesis involving the mid-to-distal anterior  wall, distal inferior wall, and apex.  Overall, systolic function is  moderately reduced with ejection fraction estimated at 40%.   . Transthoracic echocardiogram 12/30/2010    Left ventricle: LVEF is approximately 25 to 30% with hypokinesis  of the distal inferoseptal, mid/distal lateral, mid/distal   anterior, distal inferior, mid/distal mid/distal posterior and   apical walls  . Transthoracic echocardiogram 10/28/2010    Left ventricle: The cavity size was normal. There was mild concentric hypertrophy. Systolic function was lower limits of normal. The estimated ejection fraction was in the range of 50% to 55%.     History  Smoking status  . Never Smoker   Smokeless tobacco  . Never Used    History  Alcohol Use No    Family History  Problem Relation Age of Onset  . Breast cancer Mother   . Cancer Mother     breast  . Arthritis Sister   . Heart failure Father     Review of Systems: The review of systems is as above.  All other systems were reviewed and are negative.  Physical  Exam: BP 124/77  Pulse 90  Ht 5\' 5"  (1.651 m)  Wt 127 lb (57.607 kg)  BMI 21.13 kg/m2 Patient is very pleasant and in no acute distress. Skin is warm and very dry. Color is normal.  HEENT is unremarkable. Normocephalic/atraumatic. PERRL. Sclera are nonicteric. Neck is supple. No masses. No JVD. Lungs are clear. Cardiac exam shows a regular rate and rhythm. Abdomen is soft. Extremities are without edema. Gait and ROM are intact. No gross neurologic deficits noted.   LABORATORY DATA:  Lab work from 03/07/11 reviewed in detail   Assessment / Plan:

## 2011-04-04 ENCOUNTER — Telehealth: Payer: Self-pay | Admitting: Cardiology

## 2011-04-04 MED ORDER — LISINOPRIL 10 MG PO TABS: 10.0000 mg | ORAL_TABLET | Freq: Every day | ORAL | Status: DC

## 2011-04-04 NOTE — Telephone Encounter (Signed)
New Problem:    Had an appointment last week and was instructed to stop her metoprolol tartrate (LOPRESSOR) 12.5 mg TABS.  She has been continuing her lisinopril (PRINIVIL,ZESTRIL) 5 MG tablet but her blood pressure has been high all weekend. She believes that she needed to make a medication adjustment wanted to know how she should proceed.

## 2011-04-04 NOTE — Telephone Encounter (Signed)
Patient called no answer,lmtc 

## 2011-04-04 NOTE — Telephone Encounter (Signed)
F/u per call please call [phone] until 5:00

## 2011-04-04 NOTE — Telephone Encounter (Signed)
Patient called, complaining blood pressure elevated over the weekend.Stated she saw Dr. Swaziland last week and he d/c metoprolol and started lisinopril 5 mg daily.Stated she wanted to know what to do.Spoke with Dr. Sw[place]il[place] he advised to increase lisinopril to 10 mg daily.Advised to call back if needed.

## 2011-04-07 NOTE — Telephone Encounter (Signed)
FU Call: Pt calling stating that pt lisinopril is not working and wants to know if pt can take enalapril, which in the past has been successful in bring pt BP down. Please return pt call to discuss further.

## 2011-04-07 NOTE — Telephone Encounter (Signed)
Pt calling back seeing if maybe missed call when she stepped out of her office, told her she didn't miss a call but I would leave another msg for a rtn call, [phone] until 5p

## 2011-04-08 MED ORDER — ENALAPRIL MALEATE 10 MG PO TABS: 10.0000 mg | ORAL_TABLET | Freq: Every day | ORAL | Status: DC

## 2011-04-08 NOTE — Telephone Encounter (Signed)
Called stating that the Lisinopril is keeping BP down. BP has been running in 150/80 range. She wants to go back on Enalapril. Per Sunday Spillers can restart Enalapril 10 mg. Advised to continue to monitor BP. Will send Rx [facility]

## 2011-04-28 ENCOUNTER — Encounter: Payer: Self-pay | Admitting: Cardiology

## 2011-06-15 NOTE — Telephone Encounter (Signed)
See comments under routing

## 2011-07-28 ENCOUNTER — Ambulatory Visit: Payer: PRIVATE HEALTH INSURANCE | Admitting: Cardiology

## 2011-08-03 ENCOUNTER — Other Ambulatory Visit: Payer: Self-pay | Admitting: Gynecology

## 2011-08-03 DIAGNOSIS — Z1231 Encounter for screening mammogram for malignant neoplasm of breast: Secondary | ICD-10-CM

## 2011-08-09 ENCOUNTER — Encounter (INDEPENDENT_AMBULATORY_CARE_PROVIDER_SITE_OTHER): Payer: Self-pay | Admitting: Surgery

## 2011-08-09 ENCOUNTER — Ambulatory Visit
Admission: RE | Admit: 2011-08-09 | Discharge: 2011-08-09 | Disposition: A | Discharge status: Home / Self Care | Payer: PRIVATE HEALTH INSURANCE | Source: Ambulatory Visit | Attending: Gynecology | Admitting: Gynecology

## 2011-08-09 DIAGNOSIS — Z1231 Encounter for screening mammogram for malignant neoplasm of breast: Secondary | ICD-10-CM

## 2011-08-17 ENCOUNTER — Encounter: Payer: Self-pay | Admitting: Cardiology

## 2011-08-17 ENCOUNTER — Ambulatory Visit (INDEPENDENT_AMBULATORY_CARE_PROVIDER_SITE_OTHER): Payer: PRIVATE HEALTH INSURANCE | Admitting: Cardiology

## 2011-08-17 VITALS — BP 140/80 | HR 80 | Ht 65.0 in | Wt 130.4 lb

## 2011-08-17 DIAGNOSIS — I5181 Takotsubo syndrome: Secondary | ICD-10-CM

## 2011-08-17 DIAGNOSIS — I10 Essential (primary) hypertension: Secondary | ICD-10-CM

## 2011-08-17 NOTE — Patient Instructions (Signed)
Continue your current medication  I will see you as needed.    

## 2011-08-17 NOTE — Assessment & Plan Note (Signed)
She has fully recovered from her cardiomyopathy. This was related to acute distress with her thyroid surgery. I do not anticipate any problems with recurrence. She will followup with her primary care for management of her risk factors including hypertension and dyslipidemia. I'll see her on an as-needed basis.

## 2011-08-17 NOTE — Progress Notes (Signed)
Kaitlyn Allen Date of Birth: Dec 21, 1945   History of Present Illness: Kaitlyn Allen is seen today for folloow up visit. He continues to do very well from a cardiac standpoint. She denies any shortness of breath, palpitations, or edema. She's had no chest pain. She has had more difficulty with her blood pressure which was running as high as 160s systolic. She was switched to enalapril and since then her morning blood pressures are running 135-140 systolic and then goes down during the day. He is back exercising at least twice a week.    Current Outpatient Prescriptions on File Prior to Visit  Medication Sig Dispense Refill  . Calcium Carbonate (CALCIUM 500 PO) Take 1,000 mg by mouth 4 (four) times daily.       . Cholecalciferol (VITAMIN D-3 PO) Take 4,000 Units by mouth 2 (two) times daily.       Marland Kitchen levothyroxine (SYNTHROID, LEVOTHROID) 100 MCG tablet daily.      . Multiple Vitamins-Minerals (MULTIVITAMIN WITH MINERALS) tablet Take 1 tablet by mouth daily.        . simvastatin (ZOCOR) 40 MG tablet Take 40 mg by mouth at bedtime.        . vitamin E 100 UNIT capsule Take 100 Units by mouth daily.        Marland Kitchen DISCONTD: enalapril (VASOTEC) 10 MG tablet Take 1 tablet (10 mg total) by mouth daily.  30 tablet  11    Allergies  Allergen Reactions  . Sulfa Drugs Cross Reactors Hives    Past Medical History  Diagnosis Date  . Diabetes mellitus   . Thyroid goiter     s/p thyroidectomy  . Hypertension   . Hyperlipidemia   . Scleroderma   . NSTEMI (non-ST elevated myocardial infarction) August 2012    Normal coronaries per cath with moderate LV dysfunction. EF 40%  . Takotsubo cardiomyopathy August 2012  . Cancer     thyroid    Past Surgical History  Procedure Date  . Radioactiveiodine     thyrotoxicosis 1999  . Thyroidectomy     12/28/2010  . Cardiac catheterization 12/31/10    Left ventricular angiography performed in the RAO view demonstrates  moderate-to-severe hypokinesis involving  the mid-to-distal anterior  wall, distal inferior wall, and apex.  Overall, systolic function is  moderately reduced with ejection fraction estimated at 40%.   . Transthoracic echocardiogram 12/30/2010    Left ventricle: LVEF is approximately 25 to 30% with hypokinesis  of the distal inferoseptal, mid/distal lateral, mid/distal   anterior, distal inferior, mid/distal mid/distal posterior and   apical walls  . Transthoracic echocardiogram 10/28/2010    Left ventricle: The cavity size was normal. There was mild concentric hypertrophy. Systolic function was lower limits of normal. The estimated ejection fraction was in the range of 50% to 55%.     History  Smoking status  . Never Smoker   Smokeless tobacco  . Never Used    History  Alcohol Use No    Family History  Problem Relation Age of Onset  . Breast cancer Mother   . Cancer Mother     breast  . Arthritis Sister   . Heart failure Father     Review of Systems: The review of systems is as above.  All other systems were reviewed and are negative.  Physical Exam: BP 140/80  Pulse 80  Ht 5\' 5"  (1.651 m)  Wt 130 lb 6.4 oz (59.149 kg)  BMI 21.70 kg/m2 Patient is very  pleasant and in no acute distress. Skin is warm and very dry. Color is normal.  HEENT is unremarkable. Normocephalic/atraumatic. PERRL. Sclera are nonicteric. Neck is supple. No masses. No JVD. Lungs are clear. Cardiac exam shows a regular rate and rhythm. Abdomen is soft. Extremities are without edema. Gait and ROM are intact. No gross neurologic deficits noted.   LABORATORY DATA:     Assessment / Plan:

## 2011-10-05 ENCOUNTER — Encounter (INDEPENDENT_AMBULATORY_CARE_PROVIDER_SITE_OTHER): Payer: PRIVATE HEALTH INSURANCE | Admitting: Surgery

## 2011-10-31 ENCOUNTER — Encounter (INDEPENDENT_AMBULATORY_CARE_PROVIDER_SITE_OTHER): Payer: PRIVATE HEALTH INSURANCE | Admitting: Surgery

## 2011-11-09 ENCOUNTER — Ambulatory Visit (INDEPENDENT_AMBULATORY_CARE_PROVIDER_SITE_OTHER): Payer: PRIVATE HEALTH INSURANCE | Admitting: Surgery

## 2011-11-09 ENCOUNTER — Encounter (INDEPENDENT_AMBULATORY_CARE_PROVIDER_SITE_OTHER): Payer: Self-pay | Admitting: Surgery

## 2011-11-09 VITALS — BP 132/84 | HR 70 | Temp 96.8°F | Resp 14 | Ht 65.0 in | Wt 130.1 lb

## 2011-11-09 DIAGNOSIS — C73 Malignant neoplasm of thyroid gland: Secondary | ICD-10-CM

## 2011-11-09 NOTE — Progress Notes (Signed)
General Surgery [facility]  Visit Diagnoses: 1. Thyroid cancer, follicular variant of papillary carcinoma, T1a, Nx, Mx     HISTORY: Patient is a 66 year old white female who is now nearly one year out from total thyroidectomy for multinodular goiter with findings postoperatively of papillary thyroid carcinoma. Patient continues to take Synthroid 100 mcg daily under the direction of her endocrinologist. She has been followed closely by her primary care physician and her cardiologist. She now has her blood pressure under good control with Vasotec. She has not had a total body iodine scan or been treated with radioactive iodine to this point. She is scheduled to see her endocrinologist next month to discuss radioactive iodine scanning and treatment.  PERTINENT REVIEW OF SYSTEMS: Denies tremors. Denies palpitations. Denies any new masses. Denies discomfort.  EXAM: HEENT: normocephalic; pupils equal and reactive; sclerae clear; dentition good; mucous membranes moist NECK:  symmetric on extension; no palpable anterior or posterior cervical lymphadenopathy; no supraclavicular masses; no tenderness; well healed surgical incision; no masses in the thyroid bed CHEST: clear to auscultation bilaterally without rales, rhonchi, or wheezes CARDIAC: regular rate and rhythm without significant murmur; peripheral pulses are full EXT:  non-tender without edema NEURO: no gross focal deficits; no sign of tremor   IMPRESSION: Follicular variant of papillary thyroid carcinoma, no evidence of recurrent disease  PLAN: Patient will follow up with her endocrinologist next month. She will have laboratory studies at that time and consider proceeding with radioactive iodine total body scan and possibly treatment.  Patient will return to see me in one year.  Velora Heckler, MD, FACS General & Endocrine Surgery [facility]

## 2011-12-22 ENCOUNTER — Other Ambulatory Visit: Payer: Self-pay | Admitting: Endocrinology

## 2011-12-22 DIAGNOSIS — C73 Malignant neoplasm of thyroid gland: Secondary | ICD-10-CM

## 2012-02-06 ENCOUNTER — Encounter (HOSPITAL_COMMUNITY)
Admission: RE | Admit: 2012-02-06 | Discharge: 2012-02-06 | Disposition: A | Discharge status: Home / Self Care | Payer: PRIVATE HEALTH INSURANCE | Source: Ambulatory Visit | Attending: Endocrinology | Admitting: Endocrinology

## 2012-02-06 DIAGNOSIS — C73 Malignant neoplasm of thyroid gland: Secondary | ICD-10-CM | POA: Insufficient documentation

## 2012-02-06 MED ORDER — THYROTROPIN ALFA 1.1 MG IM SOLR
0.9000 mg | INTRAMUSCULAR | Status: DC
  Filled 2012-02-06: qty 0.9

## 2012-02-07 ENCOUNTER — Encounter (HOSPITAL_COMMUNITY)
Admission: RE | Admit: 2012-02-07 | Discharge: 2012-02-07 | Disposition: A | Discharge status: Home / Self Care | Payer: PRIVATE HEALTH INSURANCE | Source: Ambulatory Visit | Attending: Endocrinology | Admitting: Endocrinology

## 2012-02-07 DIAGNOSIS — C73 Malignant neoplasm of thyroid gland: Secondary | ICD-10-CM | POA: Insufficient documentation

## 2012-02-07 MED ORDER — THYROTROPIN ALFA 1.1 MG IM SOLR
0.9000 mg | INTRAMUSCULAR | Status: AC
  Administered 2012-02-07: 0.9 mg via INTRAMUSCULAR
  Filled 2012-02-07: qty 0.9

## 2012-02-08 ENCOUNTER — Encounter (HOSPITAL_COMMUNITY)
Admission: RE | Admit: 2012-02-08 | Discharge: 2012-02-08 | Disposition: A | Discharge status: Home / Self Care | Payer: PRIVATE HEALTH INSURANCE | Source: Ambulatory Visit | Attending: Endocrinology | Admitting: Endocrinology

## 2012-02-08 MED ORDER — SODIUM IODIDE I 131 CAPSULE
50.2000 | Freq: Once | INTRAVENOUS | Status: AC | PRN
  Administered 2012-02-08: 50.2 via ORAL

## 2012-02-10 MED FILL — Thyrotropin Alfa For Inj 1.1 MG: INTRAMUSCULAR | Qty: 0.9 | Status: AC

## 2012-02-15 ENCOUNTER — Encounter (HOSPITAL_COMMUNITY)
Admission: RE | Admit: 2012-02-15 | Discharge: 2012-02-15 | Disposition: A | Discharge status: Home / Self Care | Payer: PRIVATE HEALTH INSURANCE | Source: Ambulatory Visit | Attending: Endocrinology | Admitting: Endocrinology

## 2012-02-15 DIAGNOSIS — C73 Malignant neoplasm of thyroid gland: Secondary | ICD-10-CM | POA: Insufficient documentation

## 2012-09-24 ENCOUNTER — Other Ambulatory Visit (HOSPITAL_COMMUNITY): Payer: Self-pay | Admitting: Internal Medicine

## 2012-09-24 DIAGNOSIS — M349 Systemic sclerosis, unspecified: Secondary | ICD-10-CM

## 2012-10-04 ENCOUNTER — Encounter (HOSPITAL_COMMUNITY): Payer: PRIVATE HEALTH INSURANCE

## 2012-10-04 ENCOUNTER — Ambulatory Visit (HOSPITAL_COMMUNITY): Payer: PRIVATE HEALTH INSURANCE

## 2012-10-12 ENCOUNTER — Encounter (HOSPITAL_COMMUNITY): Payer: PRIVATE HEALTH INSURANCE

## 2012-10-12 ENCOUNTER — Ambulatory Visit (HOSPITAL_COMMUNITY): Payer: PRIVATE HEALTH INSURANCE

## 2012-10-23 ENCOUNTER — Other Ambulatory Visit (HOSPITAL_COMMUNITY): Payer: Self-pay | Admitting: Internal Medicine

## 2012-10-23 ENCOUNTER — Other Ambulatory Visit (HOSPITAL_COMMUNITY): Payer: Self-pay | Admitting: Pediatrics

## 2012-10-23 ENCOUNTER — Ambulatory Visit (HOSPITAL_COMMUNITY)
Admission: RE | Admit: 2012-10-23 | Discharge: 2012-10-23 | Disposition: A | Discharge status: Home / Self Care | Payer: PRIVATE HEALTH INSURANCE | Source: Ambulatory Visit | Attending: Internal Medicine | Admitting: Internal Medicine

## 2012-10-23 DIAGNOSIS — M349 Systemic sclerosis, unspecified: Secondary | ICD-10-CM | POA: Insufficient documentation

## 2012-10-23 DIAGNOSIS — I272 Pulmonary hypertension, unspecified: Secondary | ICD-10-CM

## 2012-10-23 DIAGNOSIS — I5181 Takotsubo syndrome: Secondary | ICD-10-CM | POA: Insufficient documentation

## 2012-10-23 DIAGNOSIS — I422 Other hypertrophic cardiomyopathy: Secondary | ICD-10-CM

## 2012-10-23 DIAGNOSIS — M3481 Systemic sclerosis with lung involvement: Secondary | ICD-10-CM | POA: Insufficient documentation

## 2012-10-23 NOTE — Progress Notes (Signed)
Echo Lab  2D Echocardiogram completed.  Shiori Adcox L Gabriele Zwilling, RDCS 10/23/2012 11:33 AM

## 2013-01-02 ENCOUNTER — Other Ambulatory Visit: Payer: Self-pay

## 2013-01-02 DIAGNOSIS — Z1231 Encounter for screening mammogram for malignant neoplasm of breast: Secondary | ICD-10-CM

## 2013-01-28 ENCOUNTER — Ambulatory Visit
Admission: RE | Admit: 2013-01-28 | Discharge: 2013-01-28 | Disposition: A | Discharge status: Home / Self Care | Payer: PRIVATE HEALTH INSURANCE | Source: Ambulatory Visit

## 2013-01-28 DIAGNOSIS — Z1231 Encounter for screening mammogram for malignant neoplasm of breast: Secondary | ICD-10-CM

## 2013-09-20 ENCOUNTER — Other Ambulatory Visit: Payer: Self-pay | Admitting: Endocrinology

## 2013-09-20 DIAGNOSIS — C73 Malignant neoplasm of thyroid gland: Secondary | ICD-10-CM

## 2013-10-21 ENCOUNTER — Encounter (HOSPITAL_COMMUNITY)
Admission: RE | Admit: 2013-10-21 | Discharge: 2013-10-21 | Disposition: A | Discharge status: Home / Self Care | Payer: PRIVATE HEALTH INSURANCE | Source: Ambulatory Visit | Attending: Endocrinology | Admitting: Endocrinology

## 2013-10-21 DIAGNOSIS — C73 Malignant neoplasm of thyroid gland: Secondary | ICD-10-CM | POA: Insufficient documentation

## 2013-10-21 MED ORDER — THYROTROPIN ALFA 1.1 MG IM SOLR
0.9000 mg | INTRAMUSCULAR | Status: AC
  Administered 2013-10-21: 0.9 mg via INTRAMUSCULAR
  Filled 2013-10-21: qty 0.9

## 2013-10-22 ENCOUNTER — Encounter (HOSPITAL_COMMUNITY)
Admission: RE | Admit: 2013-10-22 | Discharge: 2013-10-22 | Disposition: A | Discharge status: Home / Self Care | Payer: PRIVATE HEALTH INSURANCE | Source: Ambulatory Visit | Attending: Endocrinology | Admitting: Endocrinology

## 2013-10-22 MED ORDER — THYROTROPIN ALFA 1.1 MG IM SOLR
0.9000 mg | INTRAMUSCULAR | Status: AC
  Administered 2013-10-22: 0.9 mg via INTRAMUSCULAR
  Filled 2013-10-22: qty 0.9

## 2013-10-23 ENCOUNTER — Encounter (HOSPITAL_COMMUNITY)
Admission: RE | Admit: 2013-10-23 | Discharge: 2013-10-23 | Disposition: A | Discharge status: Home / Self Care | Payer: PRIVATE HEALTH INSURANCE | Source: Ambulatory Visit | Attending: Endocrinology | Admitting: Endocrinology

## 2013-10-25 ENCOUNTER — Encounter (HOSPITAL_COMMUNITY)
Admission: RE | Admit: 2013-10-25 | Discharge: 2013-10-25 | Disposition: A | Discharge status: Home / Self Care | Payer: PRIVATE HEALTH INSURANCE | Source: Ambulatory Visit | Attending: Endocrinology | Admitting: Endocrinology

## 2013-10-25 MED ORDER — SODIUM IODIDE I 131 CAPSULE
4.0000 | Freq: Once | INTRAVENOUS | Status: AC | PRN
  Administered 2013-10-25: 4 via ORAL

## 2014-03-20 DIAGNOSIS — M340 Progressive systemic sclerosis: Secondary | ICD-10-CM | POA: Diagnosis not present

## 2014-03-20 DIAGNOSIS — M353 Polymyalgia rheumatica: Secondary | ICD-10-CM | POA: Diagnosis not present

## 2014-06-09 DIAGNOSIS — H903 Sensorineural hearing loss, bilateral: Secondary | ICD-10-CM | POA: Diagnosis not present

## 2014-06-09 DIAGNOSIS — H6123 Impacted cerumen, bilateral: Secondary | ICD-10-CM | POA: Diagnosis not present

## 2014-08-19 ENCOUNTER — Other Ambulatory Visit: Payer: Self-pay

## 2014-08-19 DIAGNOSIS — Z1231 Encounter for screening mammogram for malignant neoplasm of breast: Secondary | ICD-10-CM

## 2014-08-20 DIAGNOSIS — I10 Essential (primary) hypertension: Secondary | ICD-10-CM | POA: Diagnosis not present

## 2014-08-20 DIAGNOSIS — C73 Malignant neoplasm of thyroid gland: Secondary | ICD-10-CM | POA: Diagnosis not present

## 2014-08-20 DIAGNOSIS — E039 Hypothyroidism, unspecified: Secondary | ICD-10-CM | POA: Diagnosis not present

## 2014-08-20 DIAGNOSIS — E559 Vitamin D deficiency, unspecified: Secondary | ICD-10-CM | POA: Diagnosis not present

## 2014-08-20 DIAGNOSIS — E1329 Other specified diabetes mellitus with other diabetic kidney complication: Secondary | ICD-10-CM | POA: Diagnosis not present

## 2014-08-26 ENCOUNTER — Ambulatory Visit
Admission: RE | Admit: 2014-08-26 | Discharge: 2014-08-26 | Disposition: A | Discharge status: Home / Self Care | Payer: Medicare Other | Source: Ambulatory Visit

## 2014-08-26 DIAGNOSIS — Z1231 Encounter for screening mammogram for malignant neoplasm of breast: Secondary | ICD-10-CM | POA: Diagnosis not present

## 2014-08-28 DIAGNOSIS — I10 Essential (primary) hypertension: Secondary | ICD-10-CM | POA: Diagnosis not present

## 2014-08-28 DIAGNOSIS — N183 Chronic kidney disease, stage 3 (moderate): Secondary | ICD-10-CM | POA: Diagnosis not present

## 2014-08-28 DIAGNOSIS — E1122 Type 2 diabetes mellitus with diabetic chronic kidney disease: Secondary | ICD-10-CM | POA: Diagnosis not present

## 2014-08-28 DIAGNOSIS — E785 Hyperlipidemia, unspecified: Secondary | ICD-10-CM | POA: Diagnosis not present

## 2014-09-02 DIAGNOSIS — E039 Hypothyroidism, unspecified: Secondary | ICD-10-CM | POA: Diagnosis not present

## 2014-09-18 ENCOUNTER — Other Ambulatory Visit (HOSPITAL_COMMUNITY): Payer: Self-pay | Admitting: Respiratory Therapy

## 2014-09-18 DIAGNOSIS — M340 Progressive systemic sclerosis: Secondary | ICD-10-CM

## 2014-09-18 DIAGNOSIS — H2513 Age-related nuclear cataract, bilateral: Secondary | ICD-10-CM | POA: Diagnosis not present

## 2014-09-18 DIAGNOSIS — M353 Polymyalgia rheumatica: Secondary | ICD-10-CM | POA: Diagnosis not present

## 2014-09-26 ENCOUNTER — Encounter (HOSPITAL_COMMUNITY): Payer: Medicare Other

## 2014-10-31 DIAGNOSIS — E039 Hypothyroidism, unspecified: Secondary | ICD-10-CM | POA: Diagnosis not present

## 2014-12-17 ENCOUNTER — Ambulatory Visit (HOSPITAL_COMMUNITY)
Admission: RE | Admit: 2014-12-17 | Discharge: 2014-12-17 | Disposition: A | Discharge status: Home / Self Care | Payer: Medicare Other | Source: Ambulatory Visit | Attending: Internal Medicine | Admitting: Internal Medicine

## 2014-12-17 DIAGNOSIS — M349 Systemic sclerosis, unspecified: Secondary | ICD-10-CM | POA: Insufficient documentation

## 2014-12-17 LAB — PULMONARY FUNCTION TEST
DL/VA % pred: 63 %
DL/VA: 3.12 ml/min/mmHg/L
DLCO unc % pred: 60 %
DLCO unc: 15.52 ml/min/mmHg
FEF 25-75 Pre: 2.53 L/sec
FEF2575-%Pred-Pre: 127 %
FEV1-%Pred-Pre: 116 %
FEV1-Pre: 2.77 L
FEV1FVC-%Pred-Pre: 105 %
FEV6-%Pred-Pre: 114 %
FEV6-Pre: 3.44 L
FEV6FVC-%Pred-Pre: 103 %
FVC-%Pred-Pre: 110 %
FVC-Pre: 3.46 L
Pre FEV1/FVC ratio: 80 %
Pre FEV6/FVC Ratio: 100 %
RV % pred: 78 %
RV: 1.76 L
TLC % pred: 102 %
TLC: 5.33 L

## 2015-01-14 DIAGNOSIS — E039 Hypothyroidism, unspecified: Secondary | ICD-10-CM | POA: Diagnosis not present

## 2015-02-23 DIAGNOSIS — E785 Hyperlipidemia, unspecified: Secondary | ICD-10-CM | POA: Diagnosis not present

## 2015-02-23 DIAGNOSIS — M859 Disorder of bone density and structure, unspecified: Secondary | ICD-10-CM | POA: Diagnosis not present

## 2015-02-23 DIAGNOSIS — I10 Essential (primary) hypertension: Secondary | ICD-10-CM | POA: Diagnosis not present

## 2015-02-23 DIAGNOSIS — E559 Vitamin D deficiency, unspecified: Secondary | ICD-10-CM | POA: Diagnosis not present

## 2015-02-23 DIAGNOSIS — Z23 Encounter for immunization: Secondary | ICD-10-CM | POA: Diagnosis not present

## 2015-02-23 DIAGNOSIS — Z0000 Encounter for general adult medical examination without abnormal findings: Secondary | ICD-10-CM | POA: Diagnosis not present

## 2015-02-23 DIAGNOSIS — I129 Hypertensive chronic kidney disease with stage 1 through stage 4 chronic kidney disease, or unspecified chronic kidney disease: Secondary | ICD-10-CM | POA: Diagnosis not present

## 2015-02-23 DIAGNOSIS — E1122 Type 2 diabetes mellitus with diabetic chronic kidney disease: Secondary | ICD-10-CM | POA: Diagnosis not present

## 2015-02-23 DIAGNOSIS — E782 Mixed hyperlipidemia: Secondary | ICD-10-CM | POA: Diagnosis not present

## 2015-03-02 DIAGNOSIS — Z23 Encounter for immunization: Secondary | ICD-10-CM | POA: Diagnosis not present

## 2015-03-02 DIAGNOSIS — N183 Chronic kidney disease, stage 3 (moderate): Secondary | ICD-10-CM | POA: Diagnosis not present

## 2015-03-02 DIAGNOSIS — I129 Hypertensive chronic kidney disease with stage 1 through stage 4 chronic kidney disease, or unspecified chronic kidney disease: Secondary | ICD-10-CM | POA: Diagnosis not present

## 2015-03-02 DIAGNOSIS — E1122 Type 2 diabetes mellitus with diabetic chronic kidney disease: Secondary | ICD-10-CM | POA: Diagnosis not present

## 2015-03-02 DIAGNOSIS — I5032 Chronic diastolic (congestive) heart failure: Secondary | ICD-10-CM | POA: Diagnosis not present

## 2015-03-19 DIAGNOSIS — M340 Progressive systemic sclerosis: Secondary | ICD-10-CM | POA: Diagnosis not present

## 2015-03-19 DIAGNOSIS — M353 Polymyalgia rheumatica: Secondary | ICD-10-CM | POA: Diagnosis not present

## 2015-08-26 DIAGNOSIS — E1122 Type 2 diabetes mellitus with diabetic chronic kidney disease: Secondary | ICD-10-CM | POA: Diagnosis not present

## 2015-08-26 DIAGNOSIS — E039 Hypothyroidism, unspecified: Secondary | ICD-10-CM | POA: Diagnosis not present

## 2015-08-26 DIAGNOSIS — I129 Hypertensive chronic kidney disease with stage 1 through stage 4 chronic kidney disease, or unspecified chronic kidney disease: Secondary | ICD-10-CM | POA: Diagnosis not present

## 2015-08-26 DIAGNOSIS — E559 Vitamin D deficiency, unspecified: Secondary | ICD-10-CM | POA: Diagnosis not present

## 2015-08-26 DIAGNOSIS — C73 Malignant neoplasm of thyroid gland: Secondary | ICD-10-CM | POA: Diagnosis not present

## 2015-09-01 ENCOUNTER — Other Ambulatory Visit: Payer: Self-pay

## 2015-09-01 ENCOUNTER — Other Ambulatory Visit: Payer: Self-pay | Admitting: Internal Medicine

## 2015-09-01 DIAGNOSIS — Z1231 Encounter for screening mammogram for malignant neoplasm of breast: Secondary | ICD-10-CM

## 2015-09-02 DIAGNOSIS — E039 Hypothyroidism, unspecified: Secondary | ICD-10-CM | POA: Diagnosis not present

## 2015-09-02 DIAGNOSIS — C73 Malignant neoplasm of thyroid gland: Secondary | ICD-10-CM | POA: Diagnosis not present

## 2015-09-03 DIAGNOSIS — I129 Hypertensive chronic kidney disease with stage 1 through stage 4 chronic kidney disease, or unspecified chronic kidney disease: Secondary | ICD-10-CM | POA: Diagnosis not present

## 2015-09-03 DIAGNOSIS — I5032 Chronic diastolic (congestive) heart failure: Secondary | ICD-10-CM | POA: Diagnosis not present

## 2015-09-03 DIAGNOSIS — N183 Chronic kidney disease, stage 3 (moderate): Secondary | ICD-10-CM | POA: Diagnosis not present

## 2015-09-03 DIAGNOSIS — E1122 Type 2 diabetes mellitus with diabetic chronic kidney disease: Secondary | ICD-10-CM | POA: Diagnosis not present

## 2015-09-11 ENCOUNTER — Ambulatory Visit
Admission: RE | Admit: 2015-09-11 | Discharge: 2015-09-11 | Disposition: A | Discharge status: Home / Self Care | Payer: Medicare Other | Source: Ambulatory Visit

## 2015-09-11 DIAGNOSIS — Z1231 Encounter for screening mammogram for malignant neoplasm of breast: Secondary | ICD-10-CM | POA: Diagnosis not present

## 2015-09-17 DIAGNOSIS — M353 Polymyalgia rheumatica: Secondary | ICD-10-CM | POA: Diagnosis not present

## 2015-09-17 DIAGNOSIS — M5431 Sciatica, right side: Secondary | ICD-10-CM | POA: Diagnosis not present

## 2015-09-17 DIAGNOSIS — I7300 Raynaud's syndrome without gangrene: Secondary | ICD-10-CM | POA: Diagnosis not present

## 2015-09-17 DIAGNOSIS — M340 Progressive systemic sclerosis: Secondary | ICD-10-CM | POA: Diagnosis not present

## 2015-10-15 DIAGNOSIS — H2513 Age-related nuclear cataract, bilateral: Secondary | ICD-10-CM | POA: Diagnosis not present

## 2015-10-15 DIAGNOSIS — H16223 Keratoconjunctivitis sicca, not specified as Sjogren's, bilateral: Secondary | ICD-10-CM | POA: Diagnosis not present

## 2015-12-08 ENCOUNTER — Encounter: Payer: Self-pay | Admitting: Gastroenterology

## 2016-02-04 DIAGNOSIS — I129 Hypertensive chronic kidney disease with stage 1 through stage 4 chronic kidney disease, or unspecified chronic kidney disease: Secondary | ICD-10-CM | POA: Diagnosis not present

## 2016-02-04 DIAGNOSIS — E1122 Type 2 diabetes mellitus with diabetic chronic kidney disease: Secondary | ICD-10-CM | POA: Diagnosis not present

## 2016-02-04 DIAGNOSIS — M8580 Other specified disorders of bone density and structure, unspecified site: Secondary | ICD-10-CM | POA: Diagnosis not present

## 2016-02-04 DIAGNOSIS — E559 Vitamin D deficiency, unspecified: Secondary | ICD-10-CM | POA: Diagnosis not present

## 2016-02-15 DIAGNOSIS — Z23 Encounter for immunization: Secondary | ICD-10-CM | POA: Diagnosis not present

## 2016-02-15 DIAGNOSIS — Z0000 Encounter for general adult medical examination without abnormal findings: Secondary | ICD-10-CM | POA: Diagnosis not present

## 2016-02-15 DIAGNOSIS — Z1389 Encounter for screening for other disorder: Secondary | ICD-10-CM | POA: Diagnosis not present

## 2016-02-16 DIAGNOSIS — E1122 Type 2 diabetes mellitus with diabetic chronic kidney disease: Secondary | ICD-10-CM | POA: Diagnosis not present

## 2016-02-16 DIAGNOSIS — H6120 Impacted cerumen, unspecified ear: Secondary | ICD-10-CM | POA: Diagnosis not present

## 2016-02-16 DIAGNOSIS — I5032 Chronic diastolic (congestive) heart failure: Secondary | ICD-10-CM | POA: Diagnosis not present

## 2016-02-16 DIAGNOSIS — I129 Hypertensive chronic kidney disease with stage 1 through stage 4 chronic kidney disease, or unspecified chronic kidney disease: Secondary | ICD-10-CM | POA: Diagnosis not present

## 2016-02-16 DIAGNOSIS — N183 Chronic kidney disease, stage 3 (moderate): Secondary | ICD-10-CM | POA: Diagnosis not present

## 2016-03-17 DIAGNOSIS — M353 Polymyalgia rheumatica: Secondary | ICD-10-CM | POA: Diagnosis not present

## 2016-03-17 DIAGNOSIS — M340 Progressive systemic sclerosis: Secondary | ICD-10-CM | POA: Diagnosis not present

## 2016-03-17 DIAGNOSIS — I7300 Raynaud's syndrome without gangrene: Secondary | ICD-10-CM | POA: Diagnosis not present

## 2016-03-17 DIAGNOSIS — M5431 Sciatica, right side: Secondary | ICD-10-CM | POA: Diagnosis not present

## 2016-08-25 DIAGNOSIS — I129 Hypertensive chronic kidney disease with stage 1 through stage 4 chronic kidney disease, or unspecified chronic kidney disease: Secondary | ICD-10-CM | POA: Diagnosis not present

## 2016-08-25 DIAGNOSIS — E785 Hyperlipidemia, unspecified: Secondary | ICD-10-CM | POA: Diagnosis not present

## 2016-08-25 DIAGNOSIS — E559 Vitamin D deficiency, unspecified: Secondary | ICD-10-CM | POA: Diagnosis not present

## 2016-08-25 DIAGNOSIS — I10 Essential (primary) hypertension: Secondary | ICD-10-CM | POA: Diagnosis not present

## 2016-08-25 DIAGNOSIS — M8580 Other specified disorders of bone density and structure, unspecified site: Secondary | ICD-10-CM | POA: Diagnosis not present

## 2016-08-25 DIAGNOSIS — E1122 Type 2 diabetes mellitus with diabetic chronic kidney disease: Secondary | ICD-10-CM | POA: Diagnosis not present

## 2016-09-01 DIAGNOSIS — E039 Hypothyroidism, unspecified: Secondary | ICD-10-CM | POA: Diagnosis not present

## 2016-09-01 DIAGNOSIS — E119 Type 2 diabetes mellitus without complications: Secondary | ICD-10-CM | POA: Diagnosis not present

## 2016-09-01 DIAGNOSIS — I10 Essential (primary) hypertension: Secondary | ICD-10-CM | POA: Diagnosis not present

## 2016-09-01 DIAGNOSIS — E7800 Pure hypercholesterolemia, unspecified: Secondary | ICD-10-CM | POA: Diagnosis not present

## 2016-09-22 ENCOUNTER — Other Ambulatory Visit: Payer: Self-pay | Admitting: Internal Medicine

## 2016-09-22 DIAGNOSIS — Z1231 Encounter for screening mammogram for malignant neoplasm of breast: Secondary | ICD-10-CM

## 2016-09-26 DIAGNOSIS — I7300 Raynaud's syndrome without gangrene: Secondary | ICD-10-CM | POA: Diagnosis not present

## 2016-09-26 DIAGNOSIS — M353 Polymyalgia rheumatica: Secondary | ICD-10-CM | POA: Diagnosis not present

## 2016-09-26 DIAGNOSIS — M5431 Sciatica, right side: Secondary | ICD-10-CM | POA: Diagnosis not present

## 2016-09-26 DIAGNOSIS — Z6821 Body mass index (BMI) 21.0-21.9, adult: Secondary | ICD-10-CM | POA: Diagnosis not present

## 2016-09-26 DIAGNOSIS — M340 Progressive systemic sclerosis: Secondary | ICD-10-CM | POA: Diagnosis not present

## 2016-10-12 ENCOUNTER — Ambulatory Visit: Payer: Medicare Other

## 2016-10-14 ENCOUNTER — Ambulatory Visit
Admission: RE | Admit: 2016-10-14 | Discharge: 2016-10-14 | Disposition: A | Discharge status: Home / Self Care | Payer: Medicare Other | Source: Ambulatory Visit | Attending: Internal Medicine | Admitting: Internal Medicine

## 2016-10-14 DIAGNOSIS — Z1231 Encounter for screening mammogram for malignant neoplasm of breast: Secondary | ICD-10-CM | POA: Diagnosis not present

## 2016-10-25 DIAGNOSIS — L501 Idiopathic urticaria: Secondary | ICD-10-CM | POA: Diagnosis not present

## 2016-11-17 DIAGNOSIS — H2513 Age-related nuclear cataract, bilateral: Secondary | ICD-10-CM | POA: Diagnosis not present

## 2016-11-17 DIAGNOSIS — E119 Type 2 diabetes mellitus without complications: Secondary | ICD-10-CM | POA: Diagnosis not present

## 2017-02-13 DIAGNOSIS — E1122 Type 2 diabetes mellitus with diabetic chronic kidney disease: Secondary | ICD-10-CM | POA: Diagnosis not present

## 2017-02-13 DIAGNOSIS — Z0000 Encounter for general adult medical examination without abnormal findings: Secondary | ICD-10-CM | POA: Diagnosis not present

## 2017-02-13 DIAGNOSIS — E559 Vitamin D deficiency, unspecified: Secondary | ICD-10-CM | POA: Diagnosis not present

## 2017-02-13 DIAGNOSIS — E7800 Pure hypercholesterolemia, unspecified: Secondary | ICD-10-CM | POA: Diagnosis not present

## 2017-02-13 DIAGNOSIS — Z23 Encounter for immunization: Secondary | ICD-10-CM | POA: Diagnosis not present

## 2017-02-13 DIAGNOSIS — I10 Essential (primary) hypertension: Secondary | ICD-10-CM | POA: Diagnosis not present

## 2017-02-13 DIAGNOSIS — E039 Hypothyroidism, unspecified: Secondary | ICD-10-CM | POA: Diagnosis not present

## 2017-02-27 DIAGNOSIS — I509 Heart failure, unspecified: Secondary | ICD-10-CM | POA: Diagnosis not present

## 2017-02-27 DIAGNOSIS — E039 Hypothyroidism, unspecified: Secondary | ICD-10-CM | POA: Diagnosis not present

## 2017-02-27 DIAGNOSIS — E119 Type 2 diabetes mellitus without complications: Secondary | ICD-10-CM | POA: Diagnosis not present

## 2017-02-27 DIAGNOSIS — M859 Disorder of bone density and structure, unspecified: Secondary | ICD-10-CM | POA: Diagnosis not present

## 2017-02-27 DIAGNOSIS — M8580 Other specified disorders of bone density and structure, unspecified site: Secondary | ICD-10-CM | POA: Diagnosis not present

## 2017-02-27 DIAGNOSIS — H6123 Impacted cerumen, bilateral: Secondary | ICD-10-CM | POA: Diagnosis not present

## 2017-02-27 DIAGNOSIS — I10 Essential (primary) hypertension: Secondary | ICD-10-CM | POA: Diagnosis not present

## 2017-02-27 DIAGNOSIS — E7800 Pure hypercholesterolemia, unspecified: Secondary | ICD-10-CM | POA: Diagnosis not present

## 2017-04-03 DIAGNOSIS — Z6822 Body mass index (BMI) 22.0-22.9, adult: Secondary | ICD-10-CM | POA: Diagnosis not present

## 2017-04-03 DIAGNOSIS — M340 Progressive systemic sclerosis: Secondary | ICD-10-CM | POA: Diagnosis not present

## 2017-04-03 DIAGNOSIS — I7300 Raynaud's syndrome without gangrene: Secondary | ICD-10-CM | POA: Diagnosis not present

## 2017-04-03 DIAGNOSIS — M5431 Sciatica, right side: Secondary | ICD-10-CM | POA: Diagnosis not present

## 2017-04-03 DIAGNOSIS — M353 Polymyalgia rheumatica: Secondary | ICD-10-CM | POA: Diagnosis not present

## 2017-04-03 DIAGNOSIS — M8580 Other specified disorders of bone density and structure, unspecified site: Secondary | ICD-10-CM | POA: Diagnosis not present

## 2017-04-18 DIAGNOSIS — E039 Hypothyroidism, unspecified: Secondary | ICD-10-CM | POA: Diagnosis not present

## 2017-04-18 DIAGNOSIS — I129 Hypertensive chronic kidney disease with stage 1 through stage 4 chronic kidney disease, or unspecified chronic kidney disease: Secondary | ICD-10-CM | POA: Diagnosis not present

## 2017-04-18 DIAGNOSIS — E1122 Type 2 diabetes mellitus with diabetic chronic kidney disease: Secondary | ICD-10-CM | POA: Diagnosis not present

## 2017-04-18 DIAGNOSIS — I5032 Chronic diastolic (congestive) heart failure: Secondary | ICD-10-CM | POA: Diagnosis not present

## 2017-04-18 DIAGNOSIS — E785 Hyperlipidemia, unspecified: Secondary | ICD-10-CM | POA: Diagnosis not present

## 2017-04-18 DIAGNOSIS — I10 Essential (primary) hypertension: Secondary | ICD-10-CM | POA: Diagnosis not present

## 2017-04-18 DIAGNOSIS — N183 Chronic kidney disease, stage 3 (moderate): Secondary | ICD-10-CM | POA: Diagnosis not present

## 2017-05-16 DIAGNOSIS — E1122 Type 2 diabetes mellitus with diabetic chronic kidney disease: Secondary | ICD-10-CM | POA: Diagnosis not present

## 2017-05-16 DIAGNOSIS — I5032 Chronic diastolic (congestive) heart failure: Secondary | ICD-10-CM | POA: Diagnosis not present

## 2017-05-16 DIAGNOSIS — I129 Hypertensive chronic kidney disease with stage 1 through stage 4 chronic kidney disease, or unspecified chronic kidney disease: Secondary | ICD-10-CM | POA: Diagnosis not present

## 2017-05-16 DIAGNOSIS — N183 Chronic kidney disease, stage 3 (moderate): Secondary | ICD-10-CM | POA: Diagnosis not present

## 2017-05-16 DIAGNOSIS — I10 Essential (primary) hypertension: Secondary | ICD-10-CM | POA: Diagnosis not present

## 2017-05-16 DIAGNOSIS — E785 Hyperlipidemia, unspecified: Secondary | ICD-10-CM | POA: Diagnosis not present

## 2017-05-16 DIAGNOSIS — E039 Hypothyroidism, unspecified: Secondary | ICD-10-CM | POA: Diagnosis not present

## 2017-08-28 DIAGNOSIS — E039 Hypothyroidism, unspecified: Secondary | ICD-10-CM | POA: Diagnosis not present

## 2017-08-28 DIAGNOSIS — E119 Type 2 diabetes mellitus without complications: Secondary | ICD-10-CM | POA: Diagnosis not present

## 2017-08-28 DIAGNOSIS — E7800 Pure hypercholesterolemia, unspecified: Secondary | ICD-10-CM | POA: Diagnosis not present

## 2017-09-04 DIAGNOSIS — N183 Chronic kidney disease, stage 3 (moderate): Secondary | ICD-10-CM | POA: Diagnosis not present

## 2017-09-04 DIAGNOSIS — E119 Type 2 diabetes mellitus without complications: Secondary | ICD-10-CM | POA: Diagnosis not present

## 2017-09-04 DIAGNOSIS — I10 Essential (primary) hypertension: Secondary | ICD-10-CM | POA: Diagnosis not present

## 2017-09-04 DIAGNOSIS — E1122 Type 2 diabetes mellitus with diabetic chronic kidney disease: Secondary | ICD-10-CM | POA: Diagnosis not present

## 2017-09-04 DIAGNOSIS — I5032 Chronic diastolic (congestive) heart failure: Secondary | ICD-10-CM | POA: Diagnosis not present

## 2017-09-04 DIAGNOSIS — E785 Hyperlipidemia, unspecified: Secondary | ICD-10-CM | POA: Diagnosis not present

## 2017-09-04 DIAGNOSIS — E039 Hypothyroidism, unspecified: Secondary | ICD-10-CM | POA: Diagnosis not present

## 2017-09-29 DIAGNOSIS — M340 Progressive systemic sclerosis: Secondary | ICD-10-CM | POA: Diagnosis not present

## 2017-09-29 DIAGNOSIS — M8580 Other specified disorders of bone density and structure, unspecified site: Secondary | ICD-10-CM | POA: Diagnosis not present

## 2017-09-29 DIAGNOSIS — I7300 Raynaud's syndrome without gangrene: Secondary | ICD-10-CM | POA: Diagnosis not present

## 2017-09-29 DIAGNOSIS — M353 Polymyalgia rheumatica: Secondary | ICD-10-CM | POA: Diagnosis not present

## 2017-09-29 DIAGNOSIS — Z6821 Body mass index (BMI) 21.0-21.9, adult: Secondary | ICD-10-CM | POA: Diagnosis not present

## 2017-09-29 DIAGNOSIS — M5431 Sciatica, right side: Secondary | ICD-10-CM | POA: Diagnosis not present

## 2017-10-19 ENCOUNTER — Other Ambulatory Visit: Payer: Self-pay | Admitting: Internal Medicine

## 2017-10-19 DIAGNOSIS — Z1231 Encounter for screening mammogram for malignant neoplasm of breast: Secondary | ICD-10-CM

## 2017-10-25 ENCOUNTER — Telehealth (HOSPITAL_COMMUNITY): Payer: Self-pay | Admitting: *Deleted

## 2017-10-25 DIAGNOSIS — M349 Systemic sclerosis, unspecified: Secondary | ICD-10-CM

## 2017-10-25 NOTE — Telephone Encounter (Signed)
Received referral from Dr Amil Amen, pt has Scleroderma an dneeds echo, pfts and appt w/Dr Bensimhon for eval of pulm htn, orders placed will schedule

## 2017-10-27 ENCOUNTER — Ambulatory Visit
Admission: RE | Admit: 2017-10-27 | Discharge: 2017-10-27 | Disposition: A | Discharge status: Home / Self Care | Payer: Medicare HMO | Source: Ambulatory Visit | Attending: Internal Medicine | Admitting: Internal Medicine

## 2017-10-27 DIAGNOSIS — Z1231 Encounter for screening mammogram for malignant neoplasm of breast: Secondary | ICD-10-CM | POA: Diagnosis not present

## 2017-10-30 ENCOUNTER — Telehealth (HOSPITAL_COMMUNITY): Payer: Self-pay | Admitting: Vascular Surgery

## 2017-11-02 DIAGNOSIS — E039 Hypothyroidism, unspecified: Secondary | ICD-10-CM | POA: Diagnosis not present

## 2017-11-02 DIAGNOSIS — E785 Hyperlipidemia, unspecified: Secondary | ICD-10-CM | POA: Diagnosis not present

## 2017-11-06 NOTE — Telephone Encounter (Signed)
Encounter opened in error

## 2017-12-20 DIAGNOSIS — E119 Type 2 diabetes mellitus without complications: Secondary | ICD-10-CM | POA: Diagnosis not present

## 2017-12-20 DIAGNOSIS — H2513 Age-related nuclear cataract, bilateral: Secondary | ICD-10-CM | POA: Diagnosis not present

## 2018-01-11 ENCOUNTER — Other Ambulatory Visit (HOSPITAL_COMMUNITY): Payer: Medicare HMO

## 2018-01-11 ENCOUNTER — Encounter (HOSPITAL_COMMUNITY): Payer: Medicare HMO

## 2018-01-11 ENCOUNTER — Encounter (HOSPITAL_COMMUNITY): Payer: Medicare HMO | Admitting: Internal Medicine

## 2018-01-29 ENCOUNTER — Encounter (HOSPITAL_COMMUNITY): Payer: Medicare HMO

## 2018-01-29 ENCOUNTER — Encounter (HOSPITAL_COMMUNITY): Payer: Medicare HMO | Admitting: Internal Medicine

## 2018-01-29 ENCOUNTER — Other Ambulatory Visit (HOSPITAL_COMMUNITY): Payer: Medicare HMO

## 2018-02-23 ENCOUNTER — Encounter (HOSPITAL_COMMUNITY): Payer: Self-pay | Admitting: Internal Medicine

## 2018-02-23 ENCOUNTER — Ambulatory Visit (HOSPITAL_COMMUNITY)
Admission: RE | Admit: 2018-02-23 | Discharge: 2018-02-23 | Disposition: A | Discharge status: Home / Self Care | Payer: Medicare HMO | Source: Ambulatory Visit | Attending: Cardiology | Admitting: Cardiology

## 2018-02-23 ENCOUNTER — Ambulatory Visit (HOSPITAL_BASED_OUTPATIENT_CLINIC_OR_DEPARTMENT_OTHER)
Admission: RE | Admit: 2018-02-23 | Discharge: 2018-02-23 | Disposition: A | Discharge status: Home / Self Care | Payer: Medicare HMO | Source: Ambulatory Visit | Attending: Internal Medicine | Admitting: Internal Medicine

## 2018-02-23 ENCOUNTER — Other Ambulatory Visit: Payer: Self-pay

## 2018-02-23 VITALS — BP 107/66 | HR 94 | Wt 133.2 lb

## 2018-02-23 DIAGNOSIS — I10 Essential (primary) hypertension: Secondary | ICD-10-CM | POA: Insufficient documentation

## 2018-02-23 DIAGNOSIS — Z79890 Hormone replacement therapy (postmenopausal): Secondary | ICD-10-CM | POA: Insufficient documentation

## 2018-02-23 DIAGNOSIS — Z803 Family history of malignant neoplasm of breast: Secondary | ICD-10-CM | POA: Diagnosis not present

## 2018-02-23 DIAGNOSIS — E785 Hyperlipidemia, unspecified: Secondary | ICD-10-CM | POA: Diagnosis not present

## 2018-02-23 DIAGNOSIS — Z882 Allergy status to sulfonamides status: Secondary | ICD-10-CM | POA: Diagnosis not present

## 2018-02-23 DIAGNOSIS — I428 Other cardiomyopathies: Secondary | ICD-10-CM | POA: Insufficient documentation

## 2018-02-23 DIAGNOSIS — E119 Type 2 diabetes mellitus without complications: Secondary | ICD-10-CM | POA: Insufficient documentation

## 2018-02-23 DIAGNOSIS — Z8261 Family history of arthritis: Secondary | ICD-10-CM | POA: Insufficient documentation

## 2018-02-23 DIAGNOSIS — M349 Systemic sclerosis, unspecified: Secondary | ICD-10-CM

## 2018-02-23 DIAGNOSIS — Z8249 Family history of ischemic heart disease and other diseases of the circulatory system: Secondary | ICD-10-CM | POA: Insufficient documentation

## 2018-02-23 DIAGNOSIS — R942 Abnormal results of pulmonary function studies: Secondary | ICD-10-CM | POA: Diagnosis not present

## 2018-02-23 DIAGNOSIS — I252 Old myocardial infarction: Secondary | ICD-10-CM | POA: Insufficient documentation

## 2018-02-23 DIAGNOSIS — Z79899 Other long term (current) drug therapy: Secondary | ICD-10-CM | POA: Diagnosis not present

## 2018-02-23 LAB — PULMONARY FUNCTION TEST
DL/VA % pred: 69 %
DL/VA: 3.4 ml/min/mmHg/L
DLCO unc % pred: 59 %
DLCO unc: 15.3 ml/min/mmHg
FEF 25-75 Pre: 2.43 L/sec
FEF2575-%Pred-Pre: 130 %
FEV1-%Pred-Pre: 113 %
FEV1-Pre: 2.61 L
FEV1FVC-%Pred-Pre: 107 %
FEV6-%Pred-Pre: 110 %
FEV6-Pre: 3.21 L
FEV6FVC-%Pred-Pre: 104 %
FVC-%Pred-Pre: 105 %
FVC-Pre: 3.22 L
Pre FEV1/FVC ratio: 81 %
Pre FEV6/FVC Ratio: 100 %
RV % pred: 78 %
RV: 1.78 L
TLC % pred: 98 %
TLC: 5.11 L

## 2018-02-23 LAB — BASIC METABOLIC PANEL
Anion gap: 8 (ref 5–15)
BUN: 22 mg/dL (ref 8–23)
CO2: 25 mmol/L (ref 22–32)
Calcium: 8.6 mg/dL — ABNORMAL LOW (ref 8.9–10.3)
Chloride: 107 mmol/L (ref 98–111)
Creatinine, Ser: 1.22 mg/dL — ABNORMAL HIGH (ref 0.44–1.00)
GFR calc Af Amer: 50 mL/min — ABNORMAL LOW (ref >60)
GFR calc non Af Amer: 43 mL/min — ABNORMAL LOW (ref >60)
Glucose, Bld: 98 mg/dL (ref 70–99)
Potassium: 4.1 mmol/L (ref 3.5–5.1)
Sodium: 140 mmol/L (ref 135–145)

## 2018-02-23 LAB — CBC
HCT: 36.6 % (ref 36.0–46.0)
Hemoglobin: 11.1 g/dL — ABNORMAL LOW (ref 12.0–15.0)
MCH: 28.5 pg (ref 26.0–34.0)
MCHC: 30.3 g/dL (ref 30.0–36.0)
MCV: 93.8 fL (ref 80.0–100.0)
Platelets: 371 10*3/uL (ref 150–400)
RBC: 3.9 MIL/uL (ref 3.87–5.11)
RDW: 13.3 % (ref 11.5–15.5)
WBC: 5.9 10*3/uL (ref 4.0–10.5)
nRBC: 0 % (ref 0.0–0.2)

## 2018-02-23 LAB — PROTIME-INR
INR: 1.02
Prothrombin Time: 13.3 seconds (ref 11.4–15.2)

## 2018-02-23 MED ORDER — ALBUTEROL SULFATE (2.5 MG/3ML) 0.083% IN NEBU: 2.5000 mg | INHALATION_SOLUTION | Freq: Once | RESPIRATORY_TRACT | Status: DC

## 2018-02-23 NOTE — H&P (View-Only) (Signed)
[facility] Consult Note   Referring Physician: Dr. Amil Amen PCP: Thressa Sheller, MD  Endo: Dr. Debbora Presto PCP-Cardiologist: Dr. Martinique  HPI:  Kaitlyn Allen is a 72 y.o. female with scleroderma and h/o Takotsubo cardiomyopathy referred by Dr. Amil Amen for pulmonary hypertension screening.   She has distant history of Takotsubo CMP related to acute distress from a thyroid surgery, and was followed transiently by Dr. Martinique. Last seen 2013.  Echo 12/2010 LVEF 25-30%, in setting of recent thyroid surgery. HK of distal inferoseptal, mid/distal lateral, mid/distal anterior, distal inferior, mid/distal posterior and apical walls.  Echo 03/2011 LVEF 55-60% Echo 10/2012 showed LVEF 40-45%  PFT 09/2014 FVC 3.6 (110%), FEV1 3.44 (116), DLCO 15.52 (60%)  PFTs today 02/23/18 shows FVC 3.22 (105), FEV1 2.61 (113%) DLCO 15.30 (59%) Echo today 02/23/18 shows EF 55% with mildly dilated RV.   She presents today for pulmonary hypertension screening in the setting of Scleroderma. Diagnosed at [facility] 25 years ago. Dr Amil Amen follows her for her Scleroderma. Walks several times a week with a [employer] group > 1 mile, silver sneakers, and does 5Ks, Last 5K completed in 56:26. Never smoker. Denies lightheadedness or dizziness with rapid standing. No trouble swallowing, has great apettite. Occasional vertigo type symptoms when rolling around in bed. Never bad enough to seek care. Hasn't taken medications yet today. Denies edema, orthopnea or PND.   Social: Retired Control and instrumentation engineer.   Review of Systems: [y] = yes, [ ]  = no   General: Weight gain [ ] ; Weight loss [ ] ; Anorexia [ ] ; Fatigue [ ] ; Fever [ ] ; Chills [ ] ; Weakness [ ]   Cardiac: Chest pain/pressure [ ] ; Resting SOB [ ] ; Exertional SOB [ ] ; Orthopnea [ ] ; Pedal Edema [ ] ; Palpitations [ ] ; Syncope [ ] ; Presyncope [ ] ; Paroxysmal nocturnal dyspnea[ ]   Pulmonary: Cough [ ] ; Wheezing[ ] ; Hemoptysis[ ] ; Sputum [ ] ; Snoring [ ]   GI:  Vomiting[ ] ; Dysphagia[ ] ; Melena[ ] ; Hematochezia [ ] ; Heartburn[ ] ; Abdominal pain [ ] ; Constipation [ ] ; Diarrhea [ ] ; BRBPR [ ]   GU: Hematuria[ ] ; Dysuria [ ] ; Nocturia[ ]   Vascular: Pain in legs with walking [ ] ; Pain in feet with lying flat [ ] ; Non-healing sores [ ] ; Stroke [ ] ; TIA [ ] ; Slurred speech [ ] ;  Neuro: Headaches[ ] ; Vertigo[ ] ; Seizures[ ] ; Paresthesias[ ] ;Blurred vision [ ] ; Diplopia [ ] ; Vision changes [ ]   Ortho/Skin: Arthritis [y]; Joint pain [y]; Muscle pain [ ] ; Joint swelling [ ] ; Back Pain [ ] ; Rash [ ]   Psych: Depression[ ] ; Anxiety[ ]   Heme: Bleeding problems [ ] ; Clotting disorders [ ] ; Anemia [ ]   Endocrine: Diabetes [ ] ; Thyroid dysfunction[y]   Past Medical History:  Diagnosis Date  . Cancer ([place])    thyroid  . Diabetes mellitus   . Hyperlipidemia   . Hypertension   . NSTEMI (non-ST elevated myocardial infarction) [facility]) August 2012   Normal coronaries per cath with moderate LV dysfunction. EF 40%  . Scleroderma ([place])   . Takotsubo cardiomyopathy August 2012  . Thyroid goiter    s/p thyroidectomy    Current Outpatient Medications  Medication Sig Dispense Refill  . Calcium Carbonate (CALCIUM 600 PO) Take 600 mg by mouth 3 (three) times daily.    . Cholecalciferol (VITAMIN D-3 PO) Take 4,000 Units by mouth 2 (two) times daily.     . enalapril (VASOTEC) 20 MG tablet Take 20 mg by mouth 2 (two) times daily.    Marland Kitchen  levothyroxine (SYNTHROID, LEVOTHROID) 100 MCG tablet daily.    . Multiple Vitamins-Minerals (MULTIVITAMIN WITH MINERALS) tablet Take 1 tablet by mouth daily.      . simvastatin (ZOCOR) 40 MG tablet Take 40 mg by mouth at bedtime.      . vitamin E 100 UNIT capsule Take 100 Units by mouth daily.       No current facility-administered medications for this encounter.     Allergies  Allergen Reactions  . Sulfa Drugs Cross Reactors Hives    Happened in childhood, believes they were all over the body.      Social History   Socioeconomic  History  . Marital status: Divorced    Spouse name: Not on file  . Number of children: Not on file  . Years of education: Not on file  . Highest education level: Not on file  Occupational History  . Not on file  Social Needs  . Financial resource strain: Not on file  . Food insecurity:    Worry: Not on file    Inability: Not on file  . Transportation needs:    Medical: Not on file    Non-medical: Not on file  Tobacco Use  . Smoking status: Never Smoker  . Smokeless tobacco: Never Used  Substance and Sexual Activity  . Alcohol use: No  . Drug use: No  . Sexual activity: Not on file  Lifestyle  . Physical activity:    Days per week: Not on file    Minutes per session: Not on file  . Stress: Not on file  Relationships  . Social connections:    Talks on phone: Not on file    Gets together: Not on file    Attends religious service: Not on file    Active member of club or organization: Not on file    Attends meetings of clubs or organizations: Not on file    Relationship status: Not on file  . Intimate partner violence:    Fear of current or ex partner: Not on file    Emotionally abused: Not on file    Physically abused: Not on file    Forced sexual activity: Not on file  Other Topics Concern  . Not on file  Social History Narrative  . Not on file      Family History  Problem Relation Age of Onset  . Breast cancer Mother   . Cancer Mother        breast  . Arthritis Sister   . Heart failure Father     Vitals:   02/23/18 1134  BP: 107/66  Pulse: 94  SpO2: 100%  Weight: 60.4 kg (133 lb 3.2 oz)    Wt Readings from Last 3 Encounters:  02/23/18 60.4 kg (133 lb 3.2 oz)  11/09/11 59 kg (130 lb 2 oz)  08/17/11 59.1 kg (130 lb 6.4 oz)    PHYSICAL EXAM: General:  Well appearing. No respiratory difficulty HEENT: Normal, tightening of skin around the mouth.Anicteric Neck: Supple. no JVD. Carotids 2+ bilat; no bruits. No lymphadenopathy or thyromegaly  appreciated. Cor: PMI nondisplaced. Regular rate & rhythm. No rubs, gallops or murmurs. NoRVlift.  Lungs: Clear Abdomen: Soft, nontender, nondistended. No hepatosplenomegaly. No bruits or masses. Good bowel sounds. Extremities: No cyanosis, clubbing, rash, or edema. Thick skin, with + sclerodactyly Neuro: Alert & oriented x 3, cranial nerves grossly intact. moves all 4 extremities w/o difficulty. Affect pleasant.  ECG: NSR 79 bpm, personally reviewed.   ASSESSMENT & PLAN:  1. Scleroderma - PFTs with moderately decreased DLCO which is likely consistent with at least mild pulmonary vascular disease. Otherwise normal. - Echo today with Normal EF, and normal to mildly dilated RV.  - Will order high res chest CT.  - Would consider RHC for baseline if patient willing. - No esophageal dysmotility.   2. H/o NICM, suspected Takotsubo - s/p Thyroid surgery in 2012  - Cath 12/2010 with normal coronaries.  - EF had improved to normal 03/2011, but was again depressed at 40-45% 10/2012. - Echo today normal appearing. Personally reviewed by Dr. Haroldine Laws   3. HTN - Well controlled on current meds.  Plan for High Res Chest CT and RHC. If normal, plan for regular, yearly follow up with Echo/PFTs.  Shirley Friar, PA-C 02/23/18   Patient seen and examined with the above-signed Advanced Practice Provider and/or Housestaff. I personally reviewed laboratory data, imaging studies and relevant notes. I independently examined the patient and formulated the important aspects of the plan. I have edited the note to reflect any of my changes or salient points. I have personally discussed the plan with the patient and/or family.   72 y/o woman with long h/o scleroderma referred by Dr. Amil Amen for [facility] screening. Echo and PFTs reviewed personally. PFTs with normal spirometry and moderately reduced DLCO which is unchanged. Echo with normal LV and mildly dilated RV but no frank PAH.   Long discussion about  incidence of PAH and pulmonary fibrosis in patients with scleroderma. Will proceed with hi-RES CT and RHC to further evaluate.   Glori Bickers, MD  11:19 PM

## 2018-02-23 NOTE — Progress Notes (Signed)
  Echocardiogram 2D Echocardiogram has been performed.  Kaitlyn Allen Kaitlyn Allen 02/23/2018, 11:43 AM

## 2018-02-23 NOTE — Patient Instructions (Signed)
Labs done today  CT scan of chest  Heart Catheterization on Fri 03/02/18  We will contact you in 1 year to schedule your next appointment, pft's and echocardiogram.   You are scheduled for a Cardiac Catheterization on Friday, October 25 with Dr. Glori Bickers.  1. Please arrive at the [facility] (Main Entrance A) at [facility]: [address] [place], [place] [address] at 11:00 AM (This time is two hours before your procedure to ensure your preparation). Free valet parking service is available.   Special note: Every effort is made to have your procedure done on time. Please understand that emergencies sometimes delay scheduled procedures.  2. Diet: Do not eat solid foods after midnight.  The patient may have clear liquids until 5am upon the day of the procedure.  3. Labs:  DONE TODAY  4. Medication instructions in preparation for your procedure:   MEDS CAN BE TAKEN WITH WATER  5. Plan for one night stay--bring personal belongings. 6. Bring a current list of your medications and current insurance cards. 7. You MUST have a responsible person to drive you home. 8. Someone MUST be with you the first 24 hours after you arrive home or your discharge will be delayed. 9. Please wear clothes that are easy to get on and off and wear slip-on shoes.  Thank you for allowing [place] to care for you!   -- [facility]

## 2018-02-23 NOTE — Progress Notes (Signed)
Advanced [facility] Failure [facility] Consult Note   Referring Physician: Dr. Amil Amen PCP: Thressa Sheller, MD  Endo: Dr. Debbora Presto PCP-Cardiologist: Dr. Martinique  HPI:  Kaitlyn Allen is a 72 y.o. female with scleroderma and h/o Takotsubo cardiomyopathy referred by Dr. Amil Amen for pulmonary hypertension screening.   She has distant history of Takotsubo CMP related to acute distress from a thyroid surgery, and was followed transiently by Dr. Martinique. Last seen 2013.  Echo 12/2010 LVEF 25-30%, in setting of recent thyroid surgery. HK of distal inferoseptal, mid/distal lateral, mid/distal anterior, distal inferior, mid/distal posterior and apical walls.  Echo 03/2011 LVEF 55-60% Echo 10/2012 showed LVEF 40-45%  PFT 09/2014 FVC 3.6 (110%), FEV1 3.44 (116), DLCO 15.52 (60%)  PFTs today 02/23/18 shows FVC 3.22 (105), FEV1 2.61 (113%) DLCO 15.30 (59%) Echo today 02/23/18 shows EF 55% with mildly dilated RV.   She presents today for pulmonary hypertension screening in the setting of Scleroderma. Diagnosed at [facility] 25 years ago. Dr Amil Amen follows her for her Scleroderma. Walks several times a week with a [employer] group > 1 mile, silver sneakers, and does 5Ks, Last 5K completed in 56:26. Never smoker. Denies lightheadedness or dizziness with rapid standing. No trouble swallowing, has great apettite. Occasional vertigo type symptoms when rolling around in bed. Never bad enough to seek care. Hasn't taken medications yet today. Denies edema, orthopnea or PND.   Social: Retired Control and instrumentation engineer.   Review of Systems: [y] = yes, [ ]  = no   General: Weight gain [ ] ; Weight loss [ ] ; Anorexia [ ] ; Fatigue [ ] ; Fever [ ] ; Chills [ ] ; Weakness [ ]   Cardiac: Chest pain/pressure [ ] ; Resting SOB [ ] ; Exertional SOB [ ] ; Orthopnea [ ] ; Pedal Edema [ ] ; Palpitations [ ] ; Syncope [ ] ; Presyncope [ ] ; Paroxysmal nocturnal dyspnea[ ]   Pulmonary: Cough [ ] ; Wheezing[ ] ; Hemoptysis[ ] ; Sputum [ ] ; Snoring [ ]   GI:  Vomiting[ ] ; Dysphagia[ ] ; Melena[ ] ; Hematochezia [ ] ; Heartburn[ ] ; Abdominal pain [ ] ; Constipation [ ] ; Diarrhea [ ] ; BRBPR [ ]   GU: Hematuria[ ] ; Dysuria [ ] ; Nocturia[ ]   Vascular: Pain in legs with walking [ ] ; Pain in feet with lying flat [ ] ; Non-healing sores [ ] ; Stroke [ ] ; TIA [ ] ; Slurred speech [ ] ;  Neuro: Headaches[ ] ; Vertigo[ ] ; Seizures[ ] ; Paresthesias[ ] ;Blurred vision [ ] ; Diplopia [ ] ; Vision changes [ ]   Ortho/Skin: Arthritis [y]; Joint pain [y]; Muscle pain [ ] ; Joint swelling [ ] ; Back Pain [ ] ; Rash [ ]   Psych: Depression[ ] ; Anxiety[ ]   Heme: Bleeding problems [ ] ; Clotting disorders [ ] ; Anemia [ ]   Endocrine: Diabetes [ ] ; Thyroid dysfunction[y]   Past Medical History:  Diagnosis Date  . Cancer ([place])    thyroid  . Diabetes mellitus   . Hyperlipidemia   . Hypertension   . NSTEMI (non-ST elevated myocardial infarction) [facility]) August 2012   Normal coronaries per cath with moderate LV dysfunction. EF 40%  . Scleroderma ([place])   . Takotsubo cardiomyopathy August 2012  . Thyroid goiter    s/p thyroidectomy    Current Outpatient Medications  Medication Sig Dispense Refill  . Calcium Carbonate (CALCIUM 600 PO) Take 600 mg by mouth 3 (three) times daily.    . Cholecalciferol (VITAMIN D-3 PO) Take 4,000 Units by mouth 2 (two) times daily.     . enalapril (VASOTEC) 20 MG tablet Take 20 mg by mouth 2 (two) times daily.    Marland Kitchen  levothyroxine (SYNTHROID, LEVOTHROID) 100 MCG tablet daily.    . Multiple Vitamins-Minerals (MULTIVITAMIN WITH MINERALS) tablet Take 1 tablet by mouth daily.      . simvastatin (ZOCOR) 40 MG tablet Take 40 mg by mouth at bedtime.      . vitamin E 100 UNIT capsule Take 100 Units by mouth daily.       No current facility-administered medications for this encounter.     Allergies  Allergen Reactions  . Sulfa Drugs Cross Reactors Hives    Happened in childhood, believes they were all over the body.      Social History   Socioeconomic  History  . Marital status: Divorced    Spouse name: Not on file  . Number of children: Not on file  . Years of education: Not on file  . Highest education level: Not on file  Occupational History  . Not on file  Social Needs  . Financial resource strain: Not on file  . Food insecurity:    Worry: Not on file    Inability: Not on file  . Transportation needs:    Medical: Not on file    Non-medical: Not on file  Tobacco Use  . Smoking status: Never Smoker  . Smokeless tobacco: Never Used  Substance and Sexual Activity  . Alcohol use: No  . Drug use: No  . Sexual activity: Not on file  Lifestyle  . Physical activity:    Days per week: Not on file    Minutes per session: Not on file  . Stress: Not on file  Relationships  . Social connections:    Talks on phone: Not on file    Gets together: Not on file    Attends religious service: Not on file    Active member of club or organization: Not on file    Attends meetings of clubs or organizations: Not on file    Relationship status: Not on file  . Intimate partner violence:    Fear of current or ex partner: Not on file    Emotionally abused: Not on file    Physically abused: Not on file    Forced sexual activity: Not on file  Other Topics Concern  . Not on file  Social History Narrative  . Not on file      Family History  Problem Relation Age of Onset  . Breast cancer Mother   . Cancer Mother        breast  . Arthritis Sister   . Heart failure Father     Vitals:   02/23/18 1134  BP: 107/66  Pulse: 94  SpO2: 100%  Weight: 60.4 kg (133 lb 3.2 oz)    Wt Readings from Last 3 Encounters:  02/23/18 60.4 kg (133 lb 3.2 oz)  11/09/11 59 kg (130 lb 2 oz)  08/17/11 59.1 kg (130 lb 6.4 oz)    PHYSICAL EXAM: General:  Well appearing. No respiratory difficulty HEENT: Normal, tightening of skin around the mouth.Anicteric Neck: Supple. no JVD. Carotids 2+ bilat; no bruits. No lymphadenopathy or thyromegaly  appreciated. Cor: PMI nondisplaced. Regular rate & rhythm. No rubs, gallops or murmurs. NoRVlift.  Lungs: Clear Abdomen: Soft, nontender, nondistended. No hepatosplenomegaly. No bruits or masses. Good bowel sounds. Extremities: No cyanosis, clubbing, rash, or edema. Thick skin, with + sclerodactyly Neuro: Alert & oriented x 3, cranial nerves grossly intact. moves all 4 extremities w/o difficulty. Affect pleasant.  ECG: NSR 79 bpm, personally reviewed.   ASSESSMENT & PLAN:  1. Scleroderma - PFTs with moderately decreased DLCO which is likely consistent with at least mild pulmonary vascular disease. Otherwise normal. - Echo today with Normal EF, and normal to mildly dilated RV.  - Will order high res chest CT.  - Would consider RHC for baseline if patient willing. - No esophageal dysmotility.   2. H/o NICM, suspected Takotsubo - s/p Thyroid surgery in 2012  - Cath 12/2010 with normal coronaries.  - EF had improved to normal 03/2011, but was again depressed at 40-45% 10/2012. - Echo today normal appearing. Personally reviewed by Dr. Haroldine Laws   3. HTN - Well controlled on current meds.  Plan for High Res Chest CT and RHC. If normal, plan for regular, yearly follow up with Echo/PFTs.  Shirley Friar, PA-C 02/23/18   Patient seen and examined with the above-signed Advanced Practice Provider and/or Housestaff. I personally reviewed laboratory data, imaging studies and relevant notes. I independently examined the patient and formulated the important aspects of the plan. I have edited the note to reflect any of my changes or salient points. I have personally discussed the plan with the patient and/or family.   72 y/o woman with long h/o scleroderma referred by Dr. Amil Amen for [facility] screening. Echo and PFTs reviewed personally. PFTs with normal spirometry and moderately reduced DLCO which is unchanged. Echo with normal LV and mildly dilated RV but no frank PAH.   Long discussion about  incidence of PAH and pulmonary fibrosis in patients with scleroderma. Will proceed with hi-RES CT and RHC to further evaluate.   Glori Bickers, MD  11:19 PM

## 2018-02-26 ENCOUNTER — Other Ambulatory Visit (HOSPITAL_COMMUNITY): Payer: Self-pay | Admitting: *Deleted

## 2018-02-26 DIAGNOSIS — E1122 Type 2 diabetes mellitus with diabetic chronic kidney disease: Secondary | ICD-10-CM | POA: Diagnosis not present

## 2018-02-26 DIAGNOSIS — E785 Hyperlipidemia, unspecified: Secondary | ICD-10-CM | POA: Diagnosis not present

## 2018-02-26 DIAGNOSIS — M349 Systemic sclerosis, unspecified: Secondary | ICD-10-CM

## 2018-02-26 DIAGNOSIS — E039 Hypothyroidism, unspecified: Secondary | ICD-10-CM | POA: Diagnosis not present

## 2018-02-26 DIAGNOSIS — E559 Vitamin D deficiency, unspecified: Secondary | ICD-10-CM | POA: Diagnosis not present

## 2018-03-02 ENCOUNTER — Ambulatory Visit (HOSPITAL_COMMUNITY): Admission: RE | Admit: 2018-03-02 | Payer: Medicare HMO | Source: Ambulatory Visit | Admitting: Internal Medicine

## 2018-03-02 ENCOUNTER — Encounter (HOSPITAL_COMMUNITY): Admission: RE | Payer: Self-pay | Source: Ambulatory Visit

## 2018-03-02 SURGERY — RIGHT HEART CATH
Anesthesia: LOCAL

## 2018-03-05 DIAGNOSIS — E7800 Pure hypercholesterolemia, unspecified: Secondary | ICD-10-CM | POA: Diagnosis not present

## 2018-03-05 DIAGNOSIS — M85852 Other specified disorders of bone density and structure, left thigh: Secondary | ICD-10-CM | POA: Diagnosis not present

## 2018-03-05 DIAGNOSIS — J8410 Pulmonary fibrosis, unspecified: Secondary | ICD-10-CM | POA: Diagnosis not present

## 2018-03-05 DIAGNOSIS — M85851 Other specified disorders of bone density and structure, right thigh: Secondary | ICD-10-CM | POA: Diagnosis not present

## 2018-03-05 DIAGNOSIS — Z0000 Encounter for general adult medical examination without abnormal findings: Secondary | ICD-10-CM | POA: Diagnosis not present

## 2018-03-05 DIAGNOSIS — I10 Essential (primary) hypertension: Secondary | ICD-10-CM | POA: Diagnosis not present

## 2018-03-05 DIAGNOSIS — E039 Hypothyroidism, unspecified: Secondary | ICD-10-CM | POA: Diagnosis not present

## 2018-03-05 DIAGNOSIS — H6123 Impacted cerumen, bilateral: Secondary | ICD-10-CM | POA: Diagnosis not present

## 2018-03-12 ENCOUNTER — Encounter (HOSPITAL_COMMUNITY): Payer: Self-pay | Admitting: Internal Medicine

## 2018-03-12 ENCOUNTER — Encounter (HOSPITAL_COMMUNITY)
Admission: RE | Disposition: A | Discharge status: Home / Self Care | Payer: Self-pay | Source: Ambulatory Visit | Attending: Internal Medicine

## 2018-03-12 ENCOUNTER — Other Ambulatory Visit: Payer: Self-pay

## 2018-03-12 ENCOUNTER — Ambulatory Visit (HOSPITAL_COMMUNITY)
Admission: RE | Admit: 2018-03-12 | Discharge: 2018-03-12 | Disposition: A | Discharge status: Home / Self Care | Payer: Medicare HMO | Source: Ambulatory Visit | Attending: Internal Medicine | Admitting: Internal Medicine

## 2018-03-12 DIAGNOSIS — R942 Abnormal results of pulmonary function studies: Secondary | ICD-10-CM

## 2018-03-12 DIAGNOSIS — E785 Hyperlipidemia, unspecified: Secondary | ICD-10-CM | POA: Insufficient documentation

## 2018-03-12 DIAGNOSIS — Z882 Allergy status to sulfonamides status: Secondary | ICD-10-CM | POA: Diagnosis not present

## 2018-03-12 DIAGNOSIS — M349 Systemic sclerosis, unspecified: Secondary | ICD-10-CM | POA: Insufficient documentation

## 2018-03-12 DIAGNOSIS — Z8249 Family history of ischemic heart disease and other diseases of the circulatory system: Secondary | ICD-10-CM | POA: Diagnosis not present

## 2018-03-12 DIAGNOSIS — Z79890 Hormone replacement therapy (postmenopausal): Secondary | ICD-10-CM | POA: Insufficient documentation

## 2018-03-12 DIAGNOSIS — I428 Other cardiomyopathies: Secondary | ICD-10-CM | POA: Diagnosis not present

## 2018-03-12 DIAGNOSIS — E119 Type 2 diabetes mellitus without complications: Secondary | ICD-10-CM | POA: Insufficient documentation

## 2018-03-12 DIAGNOSIS — Z79899 Other long term (current) drug therapy: Secondary | ICD-10-CM | POA: Diagnosis not present

## 2018-03-12 DIAGNOSIS — Z803 Family history of malignant neoplasm of breast: Secondary | ICD-10-CM | POA: Diagnosis not present

## 2018-03-12 DIAGNOSIS — Z8679 Personal history of other diseases of the circulatory system: Secondary | ICD-10-CM | POA: Diagnosis not present

## 2018-03-12 DIAGNOSIS — Z85850 Personal history of malignant neoplasm of thyroid: Secondary | ICD-10-CM | POA: Insufficient documentation

## 2018-03-12 DIAGNOSIS — I10 Essential (primary) hypertension: Secondary | ICD-10-CM | POA: Insufficient documentation

## 2018-03-12 DIAGNOSIS — I252 Old myocardial infarction: Secondary | ICD-10-CM | POA: Diagnosis not present

## 2018-03-12 DIAGNOSIS — E890 Postprocedural hypothyroidism: Secondary | ICD-10-CM | POA: Insufficient documentation

## 2018-03-12 HISTORY — PX: RIGHT HEART CATH: CATH118263

## 2018-03-12 LAB — POCT I-STAT 3, VENOUS BLOOD GAS (G3P V)
Acid-base deficit: 2 mmol/L (ref 0.0–2.0)
Acid-base deficit: 3 mmol/L — ABNORMAL HIGH (ref 0.0–2.0)
Bicarbonate: 22.6 mmol/L (ref 20.0–28.0)
Bicarbonate: 21.4 mmol/L (ref 20.0–28.0)
O2 Saturation: 66 %
O2 Saturation: 68 %
TCO2: 24 mmol/L (ref 22–32)
TCO2: 22 mmol/L (ref 22–32)
pCO2, Ven: 36.3 mmHg — ABNORMAL LOW (ref 44.0–60.0)
pCO2, Ven: 35 mmHg — ABNORMAL LOW (ref 44.0–60.0)
pH, Ven: 7.402 (ref 7.250–7.430)
pH, Ven: 7.394 (ref 7.250–7.430)
pO2, Ven: 34 mmHg (ref 32.0–45.0)
pO2, Ven: 36 mmHg (ref 32.0–45.0)

## 2018-03-12 SURGERY — RIGHT HEART CATH
Anesthesia: LOCAL

## 2018-03-12 MED ORDER — FENTANYL CITRATE (PF) 100 MCG/2ML IJ SOLN
INTRAMUSCULAR | Status: DC | PRN
  Administered 2018-03-12: 25 ug via INTRAVENOUS

## 2018-03-12 MED ORDER — SODIUM CHLORIDE 0.9 % IV SOLN: 250.0000 mL | INTRAVENOUS | Status: DC | PRN

## 2018-03-12 MED ORDER — SODIUM CHLORIDE 0.9% FLUSH: 3.0000 mL | INTRAVENOUS | Status: DC | PRN

## 2018-03-12 MED ORDER — MIDAZOLAM HCL 2 MG/2ML IJ SOLN
INTRAMUSCULAR | Status: AC
  Filled 2018-03-12: qty 2

## 2018-03-12 MED ORDER — FENTANYL CITRATE (PF) 100 MCG/2ML IJ SOLN
INTRAMUSCULAR | Status: AC
  Filled 2018-03-12: qty 2

## 2018-03-12 MED ORDER — MIDAZOLAM HCL 2 MG/2ML IJ SOLN
INTRAMUSCULAR | Status: DC | PRN
  Administered 2018-03-12: 1 mg via INTRAVENOUS

## 2018-03-12 MED ORDER — LIDOCAINE HCL (PF) 1 % IJ SOLN
INTRAMUSCULAR | Status: AC
  Filled 2018-03-12: qty 30

## 2018-03-12 MED ORDER — ONDANSETRON HCL 4 MG/2ML IJ SOLN: 4.0000 mg | Freq: Four times a day (QID) | INTRAMUSCULAR | Status: DC | PRN

## 2018-03-12 MED ORDER — LIDOCAINE HCL (PF) 1 % IJ SOLN
INTRAMUSCULAR | Status: DC | PRN
  Administered 2018-03-12: 2 mL

## 2018-03-12 MED ORDER — HEPARIN (PORCINE) IN NACL 1000-0.9 UT/500ML-% IV SOLN
INTRAVENOUS | Status: DC | PRN
  Administered 2018-03-12: 500 mL

## 2018-03-12 MED ORDER — ACETAMINOPHEN 325 MG PO TABS: 650.0000 mg | ORAL_TABLET | ORAL | Status: DC | PRN

## 2018-03-12 MED ORDER — SODIUM CHLORIDE 0.9% FLUSH: 3.0000 mL | Freq: Two times a day (BID) | INTRAVENOUS | Status: DC

## 2018-03-12 MED ORDER — SODIUM CHLORIDE 0.9 % IV SOLN
INTRAVENOUS | Status: DC
  Administered 2018-03-12: 09:00:00 via INTRAVENOUS

## 2018-03-12 MED ORDER — HEPARIN (PORCINE) IN NACL 1000-0.9 UT/500ML-% IV SOLN
INTRAVENOUS | Status: AC
  Filled 2018-03-12: qty 500

## 2018-03-12 SURGICAL SUPPLY — 5 items
CATH BALLN WEDGE 5F 110CM (CATHETERS) ×1 IMPLANT
PACK CARDIAC CATHETERIZATION (CUSTOM PROCEDURE TRAY) ×2 IMPLANT
SHEATH GLIDE SLENDER 4/5FR (SHEATH) ×1 IMPLANT
TRANSDUCER W/STOPCOCK (MISCELLANEOUS) ×2 IMPLANT
TUBING CIL FLEX 10 FLL-RA (TUBING) ×2 IMPLANT

## 2018-03-12 NOTE — Interval H&P Note (Signed)
History and Physical Interval Note:  03/12/2018 9:50 AM  Kaitlyn Allen  has presented today for surgery, with the diagnosis of pulmonary hypertension  The various methods of treatment have been discussed with the patient and family. After consideration of risks, benefits and other options for treatment, the patient has consented to  Procedure(s): RIGHT HEART CATH (N/A) as a surgical intervention .  The patient's history has been reviewed, patient examined, no change in status, stable for surgery.  I have reviewed the patient's chart and labs.  Questions were answered to the patient's satisfaction.     Mckenleigh Tarlton

## 2018-03-12 NOTE — Discharge Instructions (Signed)

## 2018-03-12 NOTE — Progress Notes (Signed)
Ambulated to bathroom to void tol well  

## 2018-03-14 ENCOUNTER — Other Ambulatory Visit (HOSPITAL_COMMUNITY): Payer: Self-pay | Admitting: Internal Medicine

## 2018-03-26 DIAGNOSIS — Z1211 Encounter for screening for malignant neoplasm of colon: Secondary | ICD-10-CM | POA: Diagnosis not present

## 2018-03-26 DIAGNOSIS — Z1212 Encounter for screening for malignant neoplasm of rectum: Secondary | ICD-10-CM | POA: Diagnosis not present

## 2018-04-02 ENCOUNTER — Other Ambulatory Visit (HOSPITAL_COMMUNITY): Payer: Self-pay | Admitting: *Deleted

## 2018-04-02 ENCOUNTER — Telehealth (HOSPITAL_COMMUNITY): Payer: Self-pay | Admitting: Vascular Surgery

## 2018-04-02 DIAGNOSIS — I272 Pulmonary hypertension, unspecified: Secondary | ICD-10-CM

## 2018-04-02 DIAGNOSIS — M340 Progressive systemic sclerosis: Secondary | ICD-10-CM | POA: Diagnosis not present

## 2018-04-02 DIAGNOSIS — M5431 Sciatica, right side: Secondary | ICD-10-CM | POA: Diagnosis not present

## 2018-04-02 DIAGNOSIS — M353 Polymyalgia rheumatica: Secondary | ICD-10-CM | POA: Diagnosis not present

## 2018-04-02 DIAGNOSIS — I7300 Raynaud's syndrome without gangrene: Secondary | ICD-10-CM | POA: Diagnosis not present

## 2018-04-02 DIAGNOSIS — M8580 Other specified disorders of bone density and structure, unspecified site: Secondary | ICD-10-CM | POA: Diagnosis not present

## 2018-04-02 DIAGNOSIS — Z6821 Body mass index (BMI) 21.0-21.9, adult: Secondary | ICD-10-CM | POA: Diagnosis not present

## 2018-04-02 NOTE — Telephone Encounter (Signed)
Left pt message giving ct appt date and time 04/09/18 @ 3;30, PT needs to come by office to get BMET before CT CHEST. Asked pt to call back to confirm appt

## 2018-04-09 ENCOUNTER — Ambulatory Visit (HOSPITAL_COMMUNITY)
Admission: RE | Admit: 2018-04-09 | Discharge: 2018-04-09 | Disposition: A | Discharge status: Home / Self Care | Payer: Medicare HMO | Source: Ambulatory Visit | Attending: Internal Medicine | Admitting: Internal Medicine

## 2018-04-09 ENCOUNTER — Ambulatory Visit (HOSPITAL_COMMUNITY): Payer: Medicare HMO

## 2018-04-09 DIAGNOSIS — I272 Pulmonary hypertension, unspecified: Secondary | ICD-10-CM

## 2018-04-09 LAB — BASIC METABOLIC PANEL
Anion gap: 12 (ref 5–15)
BUN: 12 mg/dL (ref 8–23)
CO2: 26 mmol/L (ref 22–32)
Calcium: 8.2 mg/dL — ABNORMAL LOW (ref 8.9–10.3)
Chloride: 99 mmol/L (ref 98–111)
Creatinine, Ser: 1.26 mg/dL — ABNORMAL HIGH (ref 0.44–1.00)
GFR calc Af Amer: 49 mL/min — ABNORMAL LOW (ref >60)
GFR calc non Af Amer: 43 mL/min — ABNORMAL LOW (ref >60)
Glucose, Bld: 125 mg/dL — ABNORMAL HIGH (ref 70–99)
Potassium: 3.8 mmol/L (ref 3.5–5.1)
Sodium: 137 mmol/L (ref 135–145)

## 2018-04-13 ENCOUNTER — Ambulatory Visit (HOSPITAL_COMMUNITY)
Admission: RE | Admit: 2018-04-13 | Discharge: 2018-04-13 | Disposition: A | Discharge status: Home / Self Care | Payer: Medicare HMO | Source: Ambulatory Visit | Attending: Internal Medicine | Admitting: Internal Medicine

## 2018-04-13 ENCOUNTER — Encounter (HOSPITAL_COMMUNITY): Payer: Self-pay

## 2018-04-13 DIAGNOSIS — M349 Systemic sclerosis, unspecified: Secondary | ICD-10-CM | POA: Insufficient documentation

## 2018-04-13 DIAGNOSIS — R918 Other nonspecific abnormal finding of lung field: Secondary | ICD-10-CM | POA: Diagnosis not present

## 2018-08-30 DIAGNOSIS — E1122 Type 2 diabetes mellitus with diabetic chronic kidney disease: Secondary | ICD-10-CM | POA: Diagnosis not present

## 2018-08-30 DIAGNOSIS — E039 Hypothyroidism, unspecified: Secondary | ICD-10-CM | POA: Diagnosis not present

## 2018-08-30 DIAGNOSIS — Z0000 Encounter for general adult medical examination without abnormal findings: Secondary | ICD-10-CM | POA: Diagnosis not present

## 2018-08-30 DIAGNOSIS — E7800 Pure hypercholesterolemia, unspecified: Secondary | ICD-10-CM | POA: Diagnosis not present

## 2018-10-02 DIAGNOSIS — I7300 Raynaud's syndrome without gangrene: Secondary | ICD-10-CM | POA: Diagnosis not present

## 2018-10-02 DIAGNOSIS — M5431 Sciatica, right side: Secondary | ICD-10-CM | POA: Diagnosis not present

## 2018-10-02 DIAGNOSIS — M8580 Other specified disorders of bone density and structure, unspecified site: Secondary | ICD-10-CM | POA: Diagnosis not present

## 2018-10-02 DIAGNOSIS — M340 Progressive systemic sclerosis: Secondary | ICD-10-CM | POA: Diagnosis not present

## 2018-10-02 DIAGNOSIS — M353 Polymyalgia rheumatica: Secondary | ICD-10-CM | POA: Diagnosis not present

## 2018-10-02 DIAGNOSIS — R918 Other nonspecific abnormal finding of lung field: Secondary | ICD-10-CM | POA: Diagnosis not present

## 2018-10-05 ENCOUNTER — Other Ambulatory Visit: Payer: Self-pay | Admitting: Family Medicine

## 2018-10-05 DIAGNOSIS — Z1231 Encounter for screening mammogram for malignant neoplasm of breast: Secondary | ICD-10-CM

## 2018-10-08 ENCOUNTER — Other Ambulatory Visit: Payer: Self-pay | Admitting: Internal Medicine

## 2018-10-08 DIAGNOSIS — R918 Other nonspecific abnormal finding of lung field: Secondary | ICD-10-CM

## 2018-10-23 ENCOUNTER — Ambulatory Visit
Admission: RE | Admit: 2018-10-23 | Discharge: 2018-10-23 | Disposition: A | Discharge status: Home / Self Care | Payer: Medicare HMO | Source: Ambulatory Visit | Attending: Internal Medicine | Admitting: Internal Medicine

## 2018-10-23 DIAGNOSIS — R918 Other nonspecific abnormal finding of lung field: Secondary | ICD-10-CM

## 2018-10-23 DIAGNOSIS — R911 Solitary pulmonary nodule: Secondary | ICD-10-CM | POA: Diagnosis not present

## 2018-10-23 DIAGNOSIS — J984 Other disorders of lung: Secondary | ICD-10-CM | POA: Diagnosis not present

## 2018-10-23 DIAGNOSIS — C73 Malignant neoplasm of thyroid gland: Secondary | ICD-10-CM | POA: Diagnosis not present

## 2018-11-21 ENCOUNTER — Other Ambulatory Visit: Payer: Self-pay

## 2018-11-21 ENCOUNTER — Ambulatory Visit
Admission: RE | Admit: 2018-11-21 | Discharge: 2018-11-21 | Disposition: A | Discharge status: Home / Self Care | Payer: Medicare HMO | Source: Ambulatory Visit | Attending: Family Medicine | Admitting: Family Medicine

## 2018-11-21 DIAGNOSIS — Z1231 Encounter for screening mammogram for malignant neoplasm of breast: Secondary | ICD-10-CM | POA: Diagnosis not present

## 2019-01-24 DIAGNOSIS — L989 Disorder of the skin and subcutaneous tissue, unspecified: Secondary | ICD-10-CM | POA: Diagnosis not present

## 2019-01-24 DIAGNOSIS — R7303 Prediabetes: Secondary | ICD-10-CM | POA: Diagnosis not present

## 2019-01-24 DIAGNOSIS — I129 Hypertensive chronic kidney disease with stage 1 through stage 4 chronic kidney disease, or unspecified chronic kidney disease: Secondary | ICD-10-CM | POA: Diagnosis not present

## 2019-01-24 DIAGNOSIS — E782 Mixed hyperlipidemia: Secondary | ICD-10-CM | POA: Diagnosis not present

## 2019-01-24 DIAGNOSIS — E890 Postprocedural hypothyroidism: Secondary | ICD-10-CM | POA: Diagnosis not present

## 2019-01-24 DIAGNOSIS — N183 Chronic kidney disease, stage 3 (moderate): Secondary | ICD-10-CM | POA: Diagnosis not present

## 2019-01-24 DIAGNOSIS — M8588 Other specified disorders of bone density and structure, other site: Secondary | ICD-10-CM | POA: Diagnosis not present

## 2019-01-24 DIAGNOSIS — Z23 Encounter for immunization: Secondary | ICD-10-CM | POA: Diagnosis not present

## 2019-01-24 DIAGNOSIS — E785 Hyperlipidemia, unspecified: Secondary | ICD-10-CM | POA: Diagnosis not present

## 2019-02-04 DIAGNOSIS — I509 Heart failure, unspecified: Secondary | ICD-10-CM | POA: Diagnosis not present

## 2019-02-04 DIAGNOSIS — M5431 Sciatica, right side: Secondary | ICD-10-CM | POA: Diagnosis not present

## 2019-02-04 DIAGNOSIS — R918 Other nonspecific abnormal finding of lung field: Secondary | ICD-10-CM | POA: Diagnosis not present

## 2019-02-04 DIAGNOSIS — E119 Type 2 diabetes mellitus without complications: Secondary | ICD-10-CM | POA: Diagnosis not present

## 2019-02-04 DIAGNOSIS — M353 Polymyalgia rheumatica: Secondary | ICD-10-CM | POA: Diagnosis not present

## 2019-02-04 DIAGNOSIS — M340 Progressive systemic sclerosis: Secondary | ICD-10-CM | POA: Diagnosis not present

## 2019-02-04 DIAGNOSIS — M8580 Other specified disorders of bone density and structure, unspecified site: Secondary | ICD-10-CM | POA: Diagnosis not present

## 2019-02-04 DIAGNOSIS — Z6821 Body mass index (BMI) 21.0-21.9, adult: Secondary | ICD-10-CM | POA: Diagnosis not present

## 2019-02-04 DIAGNOSIS — I7300 Raynaud's syndrome without gangrene: Secondary | ICD-10-CM | POA: Diagnosis not present

## 2019-03-13 DIAGNOSIS — Z79899 Other long term (current) drug therapy: Secondary | ICD-10-CM | POA: Diagnosis not present

## 2019-03-13 DIAGNOSIS — E785 Hyperlipidemia, unspecified: Secondary | ICD-10-CM | POA: Diagnosis not present

## 2019-06-05 ENCOUNTER — Ambulatory Visit: Payer: Medicare HMO

## 2019-06-14 ENCOUNTER — Ambulatory Visit: Payer: Medicare HMO

## 2019-06-17 ENCOUNTER — Ambulatory Visit: Payer: Medicare HMO

## 2019-06-22 ENCOUNTER — Ambulatory Visit: Payer: Medicare HMO

## 2019-07-03 DIAGNOSIS — E119 Type 2 diabetes mellitus without complications: Secondary | ICD-10-CM | POA: Diagnosis not present

## 2019-07-03 DIAGNOSIS — H35361 Drusen (degenerative) of macula, right eye: Secondary | ICD-10-CM | POA: Diagnosis not present

## 2019-07-03 DIAGNOSIS — H2513 Age-related nuclear cataract, bilateral: Secondary | ICD-10-CM | POA: Diagnosis not present

## 2019-07-03 DIAGNOSIS — H1045 Other chronic allergic conjunctivitis: Secondary | ICD-10-CM | POA: Diagnosis not present

## 2019-07-08 DIAGNOSIS — I7300 Raynaud's syndrome without gangrene: Secondary | ICD-10-CM | POA: Diagnosis not present

## 2019-07-08 DIAGNOSIS — M340 Progressive systemic sclerosis: Secondary | ICD-10-CM | POA: Diagnosis not present

## 2019-07-08 DIAGNOSIS — R918 Other nonspecific abnormal finding of lung field: Secondary | ICD-10-CM | POA: Diagnosis not present

## 2019-07-08 DIAGNOSIS — I509 Heart failure, unspecified: Secondary | ICD-10-CM | POA: Diagnosis not present

## 2019-07-08 DIAGNOSIS — M353 Polymyalgia rheumatica: Secondary | ICD-10-CM | POA: Diagnosis not present

## 2019-07-08 DIAGNOSIS — Z6821 Body mass index (BMI) 21.0-21.9, adult: Secondary | ICD-10-CM | POA: Diagnosis not present

## 2019-07-08 DIAGNOSIS — M5431 Sciatica, right side: Secondary | ICD-10-CM | POA: Diagnosis not present

## 2019-07-08 DIAGNOSIS — M8580 Other specified disorders of bone density and structure, unspecified site: Secondary | ICD-10-CM | POA: Diagnosis not present

## 2019-07-24 DIAGNOSIS — N183 Chronic kidney disease, stage 3 unspecified: Secondary | ICD-10-CM | POA: Diagnosis not present

## 2019-07-24 DIAGNOSIS — M349 Systemic sclerosis, unspecified: Secondary | ICD-10-CM | POA: Diagnosis not present

## 2019-07-24 DIAGNOSIS — R7303 Prediabetes: Secondary | ICD-10-CM | POA: Diagnosis not present

## 2019-07-24 DIAGNOSIS — E785 Hyperlipidemia, unspecified: Secondary | ICD-10-CM | POA: Diagnosis not present

## 2019-07-24 DIAGNOSIS — I129 Hypertensive chronic kidney disease with stage 1 through stage 4 chronic kidney disease, or unspecified chronic kidney disease: Secondary | ICD-10-CM | POA: Diagnosis not present

## 2019-07-24 DIAGNOSIS — E890 Postprocedural hypothyroidism: Secondary | ICD-10-CM | POA: Diagnosis not present

## 2019-10-18 ENCOUNTER — Other Ambulatory Visit: Payer: Self-pay | Admitting: Family Medicine

## 2019-10-18 DIAGNOSIS — Z1231 Encounter for screening mammogram for malignant neoplasm of breast: Secondary | ICD-10-CM

## 2019-11-06 DIAGNOSIS — L237 Allergic contact dermatitis due to plants, except food: Secondary | ICD-10-CM | POA: Diagnosis not present

## 2019-11-22 ENCOUNTER — Ambulatory Visit: Payer: Medicare HMO

## 2019-11-25 ENCOUNTER — Ambulatory Visit
Admission: RE | Admit: 2019-11-25 | Discharge: 2019-11-25 | Disposition: A | Discharge status: Home / Self Care | Payer: Medicare HMO | Source: Ambulatory Visit | Attending: Family Medicine | Admitting: Family Medicine

## 2019-11-25 ENCOUNTER — Other Ambulatory Visit: Payer: Self-pay

## 2019-11-25 DIAGNOSIS — Z1231 Encounter for screening mammogram for malignant neoplasm of breast: Secondary | ICD-10-CM

## 2020-01-08 DIAGNOSIS — I7300 Raynaud's syndrome without gangrene: Secondary | ICD-10-CM | POA: Diagnosis not present

## 2020-01-08 DIAGNOSIS — M340 Progressive systemic sclerosis: Secondary | ICD-10-CM | POA: Diagnosis not present

## 2020-01-08 DIAGNOSIS — M5431 Sciatica, right side: Secondary | ICD-10-CM | POA: Diagnosis not present

## 2020-01-08 DIAGNOSIS — M8580 Other specified disorders of bone density and structure, unspecified site: Secondary | ICD-10-CM | POA: Diagnosis not present

## 2020-01-08 DIAGNOSIS — R918 Other nonspecific abnormal finding of lung field: Secondary | ICD-10-CM | POA: Diagnosis not present

## 2020-01-08 DIAGNOSIS — Z6821 Body mass index (BMI) 21.0-21.9, adult: Secondary | ICD-10-CM | POA: Diagnosis not present

## 2020-01-08 DIAGNOSIS — M353 Polymyalgia rheumatica: Secondary | ICD-10-CM | POA: Diagnosis not present

## 2020-01-29 DIAGNOSIS — Z1159 Encounter for screening for other viral diseases: Secondary | ICD-10-CM | POA: Diagnosis not present

## 2020-01-29 DIAGNOSIS — R7303 Prediabetes: Secondary | ICD-10-CM | POA: Diagnosis not present

## 2020-01-29 DIAGNOSIS — M8588 Other specified disorders of bone density and structure, other site: Secondary | ICD-10-CM | POA: Diagnosis not present

## 2020-01-29 DIAGNOSIS — E785 Hyperlipidemia, unspecified: Secondary | ICD-10-CM | POA: Diagnosis not present

## 2020-01-29 DIAGNOSIS — E890 Postprocedural hypothyroidism: Secondary | ICD-10-CM | POA: Diagnosis not present

## 2020-01-29 DIAGNOSIS — M349 Systemic sclerosis, unspecified: Secondary | ICD-10-CM | POA: Diagnosis not present

## 2020-01-29 DIAGNOSIS — N183 Chronic kidney disease, stage 3 unspecified: Secondary | ICD-10-CM | POA: Diagnosis not present

## 2020-01-29 DIAGNOSIS — I129 Hypertensive chronic kidney disease with stage 1 through stage 4 chronic kidney disease, or unspecified chronic kidney disease: Secondary | ICD-10-CM | POA: Diagnosis not present

## 2020-01-29 DIAGNOSIS — Z23 Encounter for immunization: Secondary | ICD-10-CM | POA: Diagnosis not present

## 2020-01-29 DIAGNOSIS — Z0000 Encounter for general adult medical examination without abnormal findings: Secondary | ICD-10-CM | POA: Diagnosis not present

## 2020-01-31 ENCOUNTER — Other Ambulatory Visit: Payer: Self-pay | Admitting: Family Medicine

## 2020-01-31 DIAGNOSIS — M8580 Other specified disorders of bone density and structure, unspecified site: Secondary | ICD-10-CM

## 2020-02-05 DIAGNOSIS — E785 Hyperlipidemia, unspecified: Secondary | ICD-10-CM | POA: Diagnosis not present

## 2020-02-05 DIAGNOSIS — N183 Chronic kidney disease, stage 3 unspecified: Secondary | ICD-10-CM | POA: Diagnosis not present

## 2020-02-05 DIAGNOSIS — I10 Essential (primary) hypertension: Secondary | ICD-10-CM | POA: Diagnosis not present

## 2020-02-05 DIAGNOSIS — I129 Hypertensive chronic kidney disease with stage 1 through stage 4 chronic kidney disease, or unspecified chronic kidney disease: Secondary | ICD-10-CM | POA: Diagnosis not present

## 2020-02-05 DIAGNOSIS — E890 Postprocedural hypothyroidism: Secondary | ICD-10-CM | POA: Diagnosis not present

## 2020-04-14 DIAGNOSIS — M5459 Other low back pain: Secondary | ICD-10-CM | POA: Diagnosis not present

## 2020-04-18 DIAGNOSIS — Z20822 Contact with and (suspected) exposure to covid-19: Secondary | ICD-10-CM | POA: Diagnosis not present

## 2020-04-20 DIAGNOSIS — M545 Low back pain, unspecified: Secondary | ICD-10-CM | POA: Diagnosis not present

## 2020-04-29 DIAGNOSIS — E890 Postprocedural hypothyroidism: Secondary | ICD-10-CM | POA: Diagnosis not present

## 2020-04-29 DIAGNOSIS — E785 Hyperlipidemia, unspecified: Secondary | ICD-10-CM | POA: Diagnosis not present

## 2020-04-29 DIAGNOSIS — N183 Chronic kidney disease, stage 3 unspecified: Secondary | ICD-10-CM | POA: Diagnosis not present

## 2020-04-29 DIAGNOSIS — M545 Low back pain, unspecified: Secondary | ICD-10-CM | POA: Diagnosis not present

## 2020-04-29 DIAGNOSIS — I129 Hypertensive chronic kidney disease with stage 1 through stage 4 chronic kidney disease, or unspecified chronic kidney disease: Secondary | ICD-10-CM | POA: Diagnosis not present

## 2020-04-29 DIAGNOSIS — I10 Essential (primary) hypertension: Secondary | ICD-10-CM | POA: Diagnosis not present

## 2020-05-13 DIAGNOSIS — M545 Low back pain, unspecified: Secondary | ICD-10-CM | POA: Diagnosis not present

## 2020-05-20 DIAGNOSIS — M545 Low back pain, unspecified: Secondary | ICD-10-CM | POA: Diagnosis not present

## 2020-06-10 ENCOUNTER — Ambulatory Visit
Admission: RE | Admit: 2020-06-10 | Discharge: 2020-06-10 | Disposition: A | Discharge status: Home / Self Care | Payer: Medicare HMO | Source: Ambulatory Visit | Attending: Family Medicine | Admitting: Family Medicine

## 2020-06-10 ENCOUNTER — Other Ambulatory Visit: Payer: Self-pay

## 2020-06-10 DIAGNOSIS — Z780 Asymptomatic menopausal state: Secondary | ICD-10-CM | POA: Diagnosis not present

## 2020-06-10 DIAGNOSIS — M8589 Other specified disorders of bone density and structure, multiple sites: Secondary | ICD-10-CM | POA: Diagnosis not present

## 2020-06-10 DIAGNOSIS — M8580 Other specified disorders of bone density and structure, unspecified site: Secondary | ICD-10-CM

## 2020-06-12 IMAGING — CT CT CHEST HIGH RESOLUTION W/O CM
3 of 5 series · 14 of 36 positions shown, 15 images · non-contrast
Comparison: No priors.

CLINICAL DATA: 72-year-old female with history of systemic
sclerosis and pulmonary hypertension. Evaluate for interstitial lung
disease.

EXAM:
CT CHEST WITHOUT CONTRAST
TECHNIQUE: Multidetector CT imaging of the chest was performed following the
standard protocol without intravenous contrast. High resolution
imaging of the lungs, as well as inspiratory and expiratory imaging,
was performed.

[Series 3: insp 12 images 1.0 b80s · axial · 0.77mm/px · z∈[+1044,+1346]mm · 3 of 22 slices shown, 4 images]
[im 1/22  mediastinal]
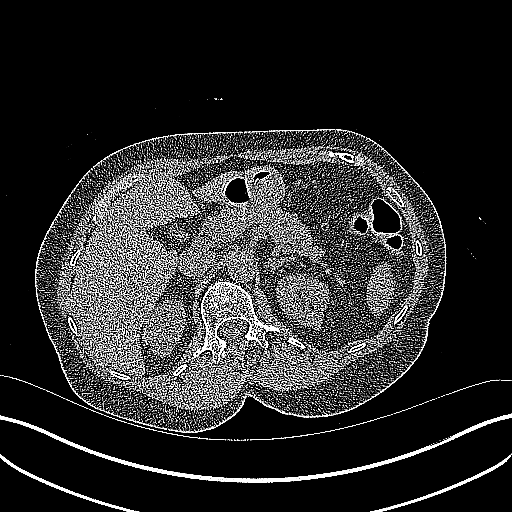
[im 1/22  lung]
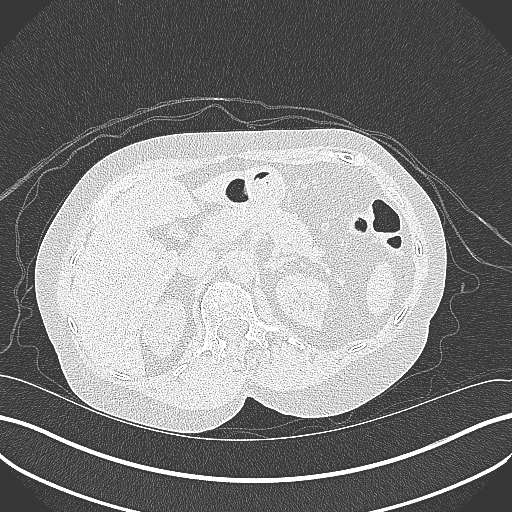
[im 11/22  lung]
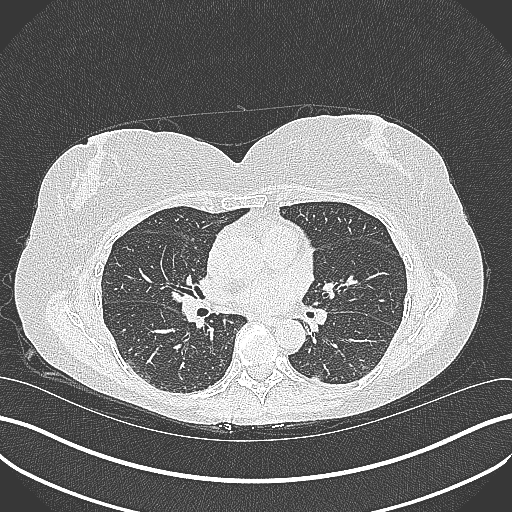
[im 22/22  lung]
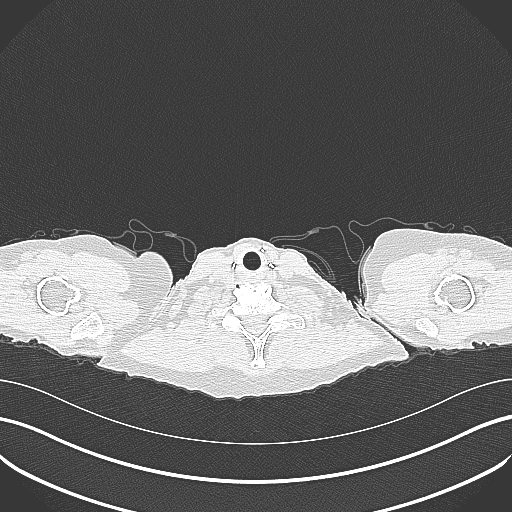

[Series 5: high resolution 2.0 b30f · axial · 0.77mm/px · z∈[+1078,+1316]mm · 8 of 149 slices shown]
[im 15/149  lung]
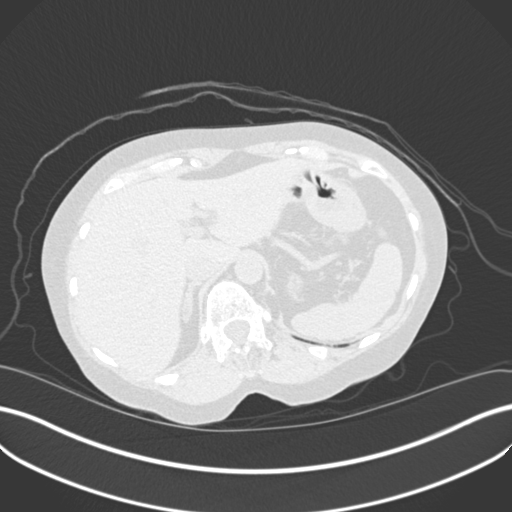
[im 29/149  lung]
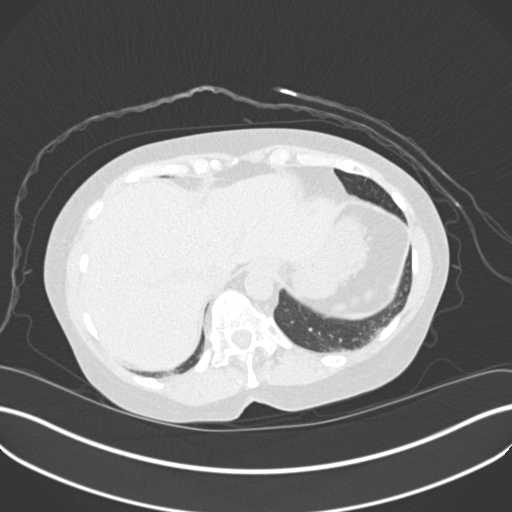
[im 50/149  lung]
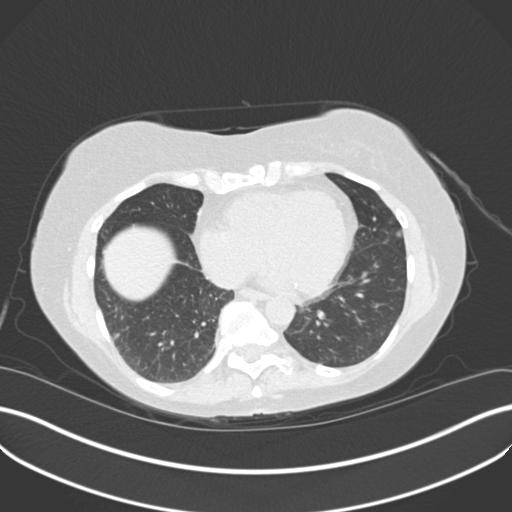
[im 64/149  lung]
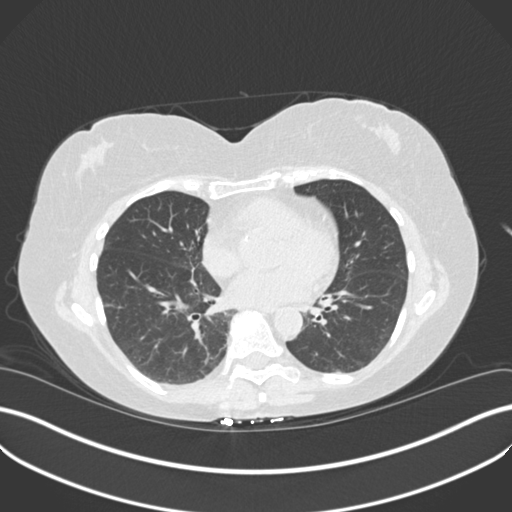
[im 85/149  lung]
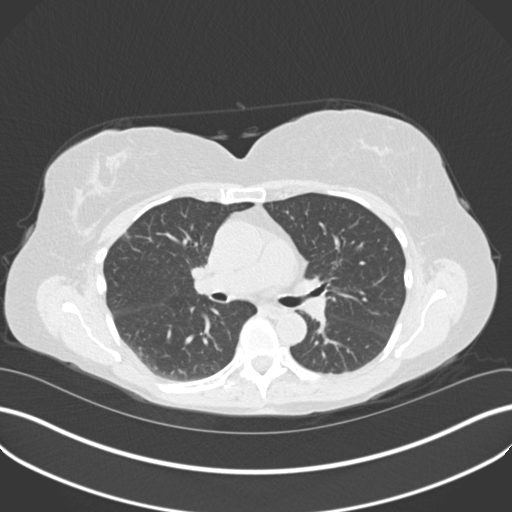
[im 99/149  lung]
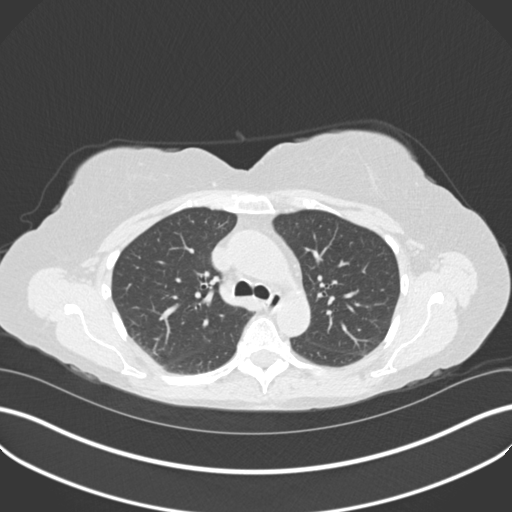
[im 120/149  lung]
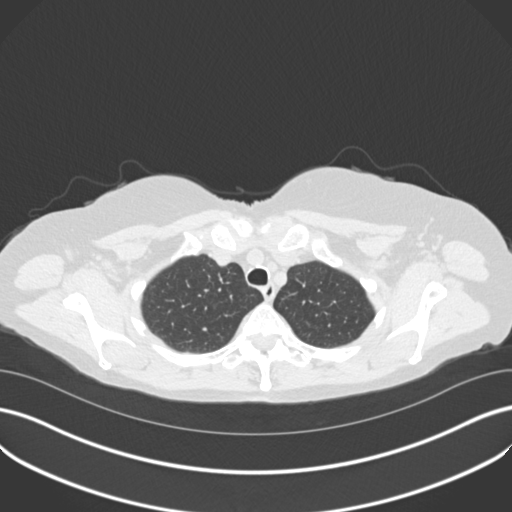
[im 134/149  lung]
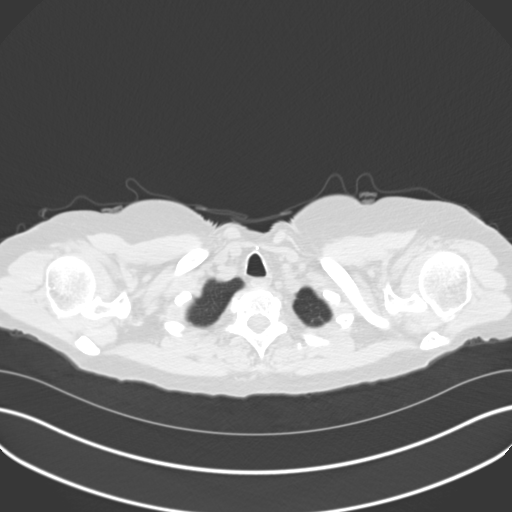

[Series 8: coronal · coronal · 0.62mm/px · 3 of 127 slices shown]
[im 26/127  lung]
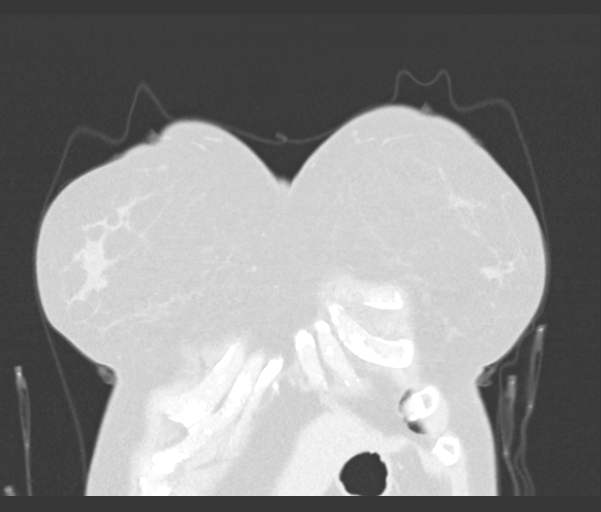
[im 51/127  lung]
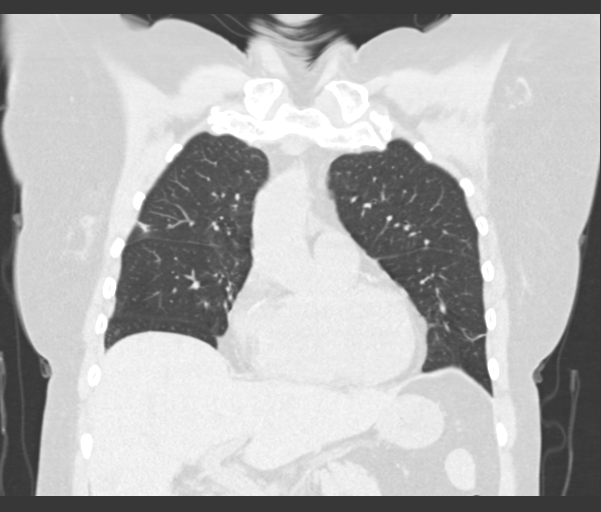
[im 76/127  lung]
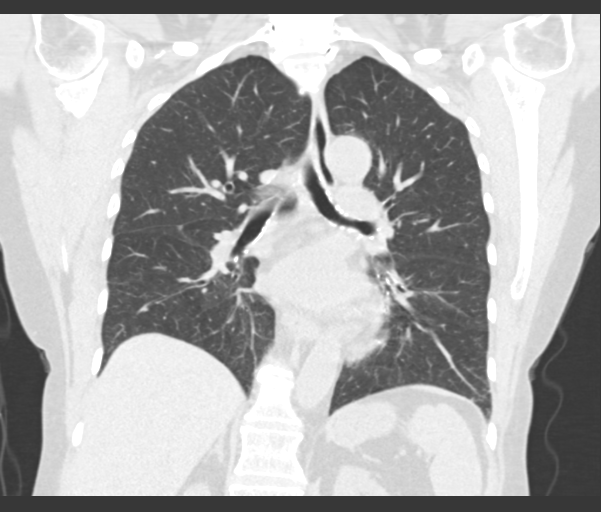

[14 of 36 positions shown; findings below may reference images not displayed]

FINDINGS: Cardiovascular: Heart size is normal. There is no significant
pericardial fluid, thickening or pericardial calcification. There is
aortic atherosclerosis, as well as atherosclerosis of the great
vessels of the mediastinum and the coronary arteries, including
calcified atherosclerotic plaque in the left main, left anterior
descending, left circumflex and right coronary arteries.

Mediastinum/Nodes: No pathologically enlarged mediastinal or hilar
lymph nodes. Please note that accurate exclusion of hilar adenopathy
is limited on noncontrast CT scans. Esophagus is unremarkable in
appearance. No axillary lymphadenopathy.

Lungs/Pleura: A few scattered small pulmonary nodules are noted in
the lungs, largest of which is in the right upper lobe anteriorly
(axial image 132 of series 10) measuring 7 mm. No larger more
suspicious appearing pulmonary nodules or masses are noted. No acute
consolidative airspace disease. No pleural effusions.
High-resolution images demonstrate no significant regions of
ground-glass attenuation, subpleural reticulation, parenchymal
banding, traction bronchiectasis or frank honeycombing. Inspiratory
and expiratory imaging demonstrates moderate air trapping indicative
of small airways disease. There's also considerable collapse of the
trachea and mainstem bronchi on expiratory phase images indicative
of tracheobronchomalacia.

Upper Abdomen: Aortic atherosclerosis.

Musculoskeletal: There are no aggressive appearing lytic or blastic
lesions noted in the visualized portions of the skeleton.
IMPRESSION: 1. No evidence of interstitial lung disease.
2. Severe tracheobronchomalacia.
3. Moderate air trapping indicative of small airways disease.
4. Small pulmonary nodules measuring 7 mm or less in size in the
lungs. Non-contrast chest CT at 3-6 months is recommended. If the
nodules are stable at time of repeat CT, then future CT at 18-24
months (from today's scan) is considered optional for low-risk
patients, but is recommended for high-risk patients. This
recommendation follows the consensus statement: Guidelines for
Management of Incidental Pulmonary Nodules Detected on CT Images:
5. Aortic atherosclerosis, in addition to left main and 3 vessel
coronary artery disease. Assessment for potential risk factor
modification, dietary therapy or pharmacologic therapy may be
warranted, if clinically indicated.

Aortic Atherosclerosis ([id number]-V39.9).

## 2020-07-07 DIAGNOSIS — M8580 Other specified disorders of bone density and structure, unspecified site: Secondary | ICD-10-CM | POA: Diagnosis not present

## 2020-07-07 DIAGNOSIS — M5431 Sciatica, right side: Secondary | ICD-10-CM | POA: Diagnosis not present

## 2020-07-07 DIAGNOSIS — R918 Other nonspecific abnormal finding of lung field: Secondary | ICD-10-CM | POA: Diagnosis not present

## 2020-07-07 DIAGNOSIS — Z6821 Body mass index (BMI) 21.0-21.9, adult: Secondary | ICD-10-CM | POA: Diagnosis not present

## 2020-07-07 DIAGNOSIS — M353 Polymyalgia rheumatica: Secondary | ICD-10-CM | POA: Diagnosis not present

## 2020-07-07 DIAGNOSIS — M340 Progressive systemic sclerosis: Secondary | ICD-10-CM | POA: Diagnosis not present

## 2020-07-07 DIAGNOSIS — I7300 Raynaud's syndrome without gangrene: Secondary | ICD-10-CM | POA: Diagnosis not present

## 2020-07-09 DIAGNOSIS — I129 Hypertensive chronic kidney disease with stage 1 through stage 4 chronic kidney disease, or unspecified chronic kidney disease: Secondary | ICD-10-CM | POA: Diagnosis not present

## 2020-07-09 DIAGNOSIS — E890 Postprocedural hypothyroidism: Secondary | ICD-10-CM | POA: Diagnosis not present

## 2020-07-09 DIAGNOSIS — N183 Chronic kidney disease, stage 3 unspecified: Secondary | ICD-10-CM | POA: Diagnosis not present

## 2020-07-09 DIAGNOSIS — I10 Essential (primary) hypertension: Secondary | ICD-10-CM | POA: Diagnosis not present

## 2020-07-09 DIAGNOSIS — E785 Hyperlipidemia, unspecified: Secondary | ICD-10-CM | POA: Diagnosis not present

## 2020-07-16 ENCOUNTER — Telehealth (HOSPITAL_COMMUNITY): Payer: Self-pay | Admitting: *Deleted

## 2020-07-16 DIAGNOSIS — M349 Systemic sclerosis, unspecified: Secondary | ICD-10-CM

## 2020-07-16 NOTE — Telephone Encounter (Signed)
Pt past due for f/u, echo, and pft's. Orders placed, will arrange

## 2020-07-29 DIAGNOSIS — I129 Hypertensive chronic kidney disease with stage 1 through stage 4 chronic kidney disease, or unspecified chronic kidney disease: Secondary | ICD-10-CM | POA: Diagnosis not present

## 2020-07-29 DIAGNOSIS — Z23 Encounter for immunization: Secondary | ICD-10-CM | POA: Diagnosis not present

## 2020-07-29 DIAGNOSIS — E890 Postprocedural hypothyroidism: Secondary | ICD-10-CM | POA: Diagnosis not present

## 2020-07-29 DIAGNOSIS — R7303 Prediabetes: Secondary | ICD-10-CM | POA: Diagnosis not present

## 2020-07-29 DIAGNOSIS — M349 Systemic sclerosis, unspecified: Secondary | ICD-10-CM | POA: Diagnosis not present

## 2020-07-29 DIAGNOSIS — N183 Chronic kidney disease, stage 3 unspecified: Secondary | ICD-10-CM | POA: Diagnosis not present

## 2020-07-29 DIAGNOSIS — E785 Hyperlipidemia, unspecified: Secondary | ICD-10-CM | POA: Diagnosis not present

## 2020-07-29 DIAGNOSIS — M8588 Other specified disorders of bone density and structure, other site: Secondary | ICD-10-CM | POA: Diagnosis not present

## 2020-09-10 DIAGNOSIS — M6281 Muscle weakness (generalized): Secondary | ICD-10-CM | POA: Diagnosis not present

## 2020-09-28 ENCOUNTER — Other Ambulatory Visit (HOSPITAL_COMMUNITY)
Admission: RE | Admit: 2020-09-28 | Discharge: 2020-09-28 | Disposition: A | Discharge status: Home / Self Care | Payer: Medicare HMO | Source: Ambulatory Visit | Attending: Internal Medicine | Admitting: Internal Medicine

## 2020-09-28 DIAGNOSIS — Z01812 Encounter for preprocedural laboratory examination: Secondary | ICD-10-CM | POA: Diagnosis not present

## 2020-09-28 DIAGNOSIS — Z20822 Contact with and (suspected) exposure to covid-19: Secondary | ICD-10-CM | POA: Diagnosis not present

## 2020-09-28 LAB — SARS CORONAVIRUS 2 (TAT 6-24 HRS): SARS Coronavirus 2: NEGATIVE

## 2020-09-30 ENCOUNTER — Ambulatory Visit (INDEPENDENT_AMBULATORY_CARE_PROVIDER_SITE_OTHER)
Admission: RE | Admit: 2020-09-30 | Discharge: 2020-09-30 | Disposition: A | Discharge status: Home / Self Care | Payer: Medicare HMO | Source: Ambulatory Visit | Attending: Internal Medicine | Admitting: Internal Medicine

## 2020-09-30 ENCOUNTER — Other Ambulatory Visit: Payer: Self-pay

## 2020-09-30 ENCOUNTER — Encounter (HOSPITAL_COMMUNITY): Payer: Self-pay | Admitting: Internal Medicine

## 2020-09-30 ENCOUNTER — Ambulatory Visit (HOSPITAL_COMMUNITY)
Admission: RE | Admit: 2020-09-30 | Discharge: 2020-09-30 | Disposition: A | Discharge status: Home / Self Care | Payer: Medicare HMO | Source: Ambulatory Visit | Attending: Family Medicine | Admitting: Family Medicine

## 2020-09-30 ENCOUNTER — Ambulatory Visit (HOSPITAL_COMMUNITY)
Admission: RE | Admit: 2020-09-30 | Discharge: 2020-09-30 | Disposition: A | Discharge status: Home / Self Care | Payer: Medicare HMO | Source: Ambulatory Visit | Attending: Internal Medicine | Admitting: Internal Medicine

## 2020-09-30 VITALS — BP 130/70 | HR 87 | Wt 136.0 lb

## 2020-09-30 DIAGNOSIS — I428 Other cardiomyopathies: Secondary | ICD-10-CM | POA: Diagnosis not present

## 2020-09-30 DIAGNOSIS — I10 Essential (primary) hypertension: Secondary | ICD-10-CM | POA: Insufficient documentation

## 2020-09-30 DIAGNOSIS — M349 Systemic sclerosis, unspecified: Secondary | ICD-10-CM | POA: Diagnosis not present

## 2020-09-30 DIAGNOSIS — I5181 Takotsubo syndrome: Secondary | ICD-10-CM

## 2020-09-30 DIAGNOSIS — M3481 Systemic sclerosis with lung involvement: Secondary | ICD-10-CM | POA: Insufficient documentation

## 2020-09-30 DIAGNOSIS — M3489 Other systemic sclerosis: Secondary | ICD-10-CM | POA: Diagnosis not present

## 2020-09-30 HISTORY — DX: Scoliosis, unspecified: M41.9

## 2020-09-30 HISTORY — DX: Other specified disorders of bone density and structure, unspecified site: M85.80

## 2020-09-30 LAB — PULMONARY FUNCTION TEST
DL/VA % pred: 71 %
DL/VA: 2.91 ml/min/mmHg/L
DLCO unc % pred: 67 %
DLCO unc: 13.41 ml/min/mmHg
FEF 25-75 Post: 2.87 L/sec
FEF 25-75 Pre: 2.4 L/sec
FEF2575-%Change-Post: 19 %
FEF2575-%Pred-Post: 167 %
FEF2575-%Pred-Pre: 139 %
FEV1-%Change-Post: 7 %
FEV1-%Pred-Post: 126 %
FEV1-%Pred-Pre: 117 %
FEV1-Post: 2.8 L
FEV1-Pre: 2.59 L
FEV1FVC-%Change-Post: 4 %
FEV1FVC-%Pred-Pre: 107 %
FEV6-%Change-Post: 3 %
FEV6-%Pred-Post: 119 %
FEV6-%Pred-Pre: 115 %
FEV6-Post: 3.33 L
FEV6-Pre: 3.22 L
FEV6FVC-%Pred-Post: 105 %
FEV6FVC-%Pred-Pre: 105 %
FVC-%Change-Post: 3 %
FVC-%Pred-Post: 113 %
FVC-%Pred-Pre: 109 %
FVC-Post: 3.33 L
FVC-Pre: 3.22 L
Post FEV1/FVC ratio: 84 %
Post FEV6/FVC ratio: 100 %
Pre FEV1/FVC ratio: 80 %
Pre FEV6/FVC Ratio: 100 %
RV % pred: 77 %
RV: 1.82 L
TLC % pred: 101 %
TLC: 5.29 L

## 2020-09-30 LAB — ECHOCARDIOGRAM COMPLETE
Area-P 1/2: 4.77 cm2
S' Lateral: 2.5 cm

## 2020-09-30 MED ORDER — ALBUTEROL SULFATE (2.5 MG/3ML) 0.083% IN NEBU
2.5000 mg | INHALATION_SOLUTION | Freq: Once | RESPIRATORY_TRACT | Status: AC
  Administered 2020-09-30: 2.5 mg via RESPIRATORY_TRACT

## 2020-09-30 NOTE — Patient Instructions (Addendum)
EKG done today.  No Labs done today.   No other medication changes were made. Please continue all current medications as prescribed.  Your physician recommends that you schedule a follow-up appointment in: 1 year with an echo and pulmonary function test prior to your exam.   Your physician has requested that you have an echocardiogram. Echocardiography is a painless test that uses sound waves to create images of your heart. It provides your doctor with information about the size and shape of your heart and how well your heart's chambers and valves are working. This procedure takes approximately one hour. There are no restrictions for this procedure.  Your physician has recommended that you have a pulmonary function test. Pulmonary Function Tests are a group of tests that measure how well air moves in and out of your lungs.   If you have any questions or concerns before your next appointment please send [place] a message through [place] or call our office at [phone].    TO LEAVE A MESSAGE FOR THE NURSE SELECT OPTION 2, PLEASE LEAVE A MESSAGE INCLUDING: . YOUR NAME . DATE OF BIRTH . CALL BACK NUMBER . REASON FOR CALL**this is important as we prioritize the call backs  YOU WILL RECEIVE A CALL BACK THE SAME DAY AS LONG AS YOU CALL BEFORE 4:00 PM   Do the following things EVERYDAY: 1) Weigh yourself in the morning before breakfast. Write it down and keep it in a log. 2) Take your medicines as prescribed 3) Eat low salt foods--Limit salt (sodium) to 2000 mg per day.  4) Stay as active as you can everyday 5) Limit all fluids for the day to less than 2 liters   At the [facility], you and your health needs are our priority. As part of our continuing mission to provide you with exceptional heart care, we have created designated Provider Care Teams. These Care Teams include your primary Cardiologist (physician) and Advanced Practice Providers (APPs- Physician Assistants and  Nurse Practitioners) who all work together to provide you with the care you need, when you need it.   You may see any of the following providers on your designated Care Team at your next follow up: Marland Kitchen Dr Glori Bickers . Dr Loralie Champagne . Darrick Grinder, NP . Lyda Jester, PA . Audry Riles, PharmD   Please be sure to bring in all your medications bottles to every appointment.

## 2020-09-30 NOTE — Progress Notes (Signed)
Advanced Heart Failure Clinic Note   Referring Physician: Dr. Amil Amen PCP: Harlan Stains, MD  Endo: Dr. Debbora Presto PCP-Cardiologist: Dr. Martinique  HPI:  Kaitlyn Allen is a 75 y.o. female with scleroderma and h/o Takotsubo cardiomyopathy referred by Dr. Amil Amen for pulmonary hypertension screening.   She has distant history of Takotsubo CMP related to acute distress from a thyroid surgery, and was followed transiently by Dr. Martinique. Last seen 2013.  Echo 12/2010 LVEF 25-30%, in setting of recent thyroid surgery. HK of distal inferoseptal, mid/distal lateral, mid/distal anterior, distal inferior, mid/distal posterior and apical walls.  Echo 03/2011 LVEF 55-60% Echo 10/2012 showed LVEF 40-45%  PFT 09/2014 FVC 3.6 (110%), FEV1 3.44 (116), DLCO 15.52 (60%)  PFTs 02/23/18 shows FVC 3.22 (105), FEV1 2.61 (113%) DLCO 15.30 (59%) Echo 02/23/18 shows EF 55% with mildly dilated RV.   ECHO 09/30/20 EF 65-70%, normal RV, no TR, no evidence of PH or RV strain PFT's  09/30/20 pending  Initially established with me 02/2018 for pulmonary hypertension screening in the setting of Scleroderma. Diagnosed at [facility] years ago. Dr Amil Amen follows her for her Scleroderma.  We arranged RHC in 11/19 which demonstrated normal findings overall PA = 23/7 (15)   Diagnosed with scoliosis undergoing PT.  Walking denies dyspnea, has a walking group goes about 2 miles a few times a week.  No chest pain. Sleeping well no orthopnea.      Social: Retired Control and instrumentation engineer.     Past Medical History:  Diagnosis Date  . Cancer ([place])    thyroid  . Diabetes mellitus   . Hyperlipidemia   . Hypertension   . NSTEMI (non-ST elevated myocardial infarction) [facility]) August 2012   Normal coronaries per cath with moderate LV dysfunction. EF 40%  . Osteopenia   . Scleroderma ([place])   . Scoliosis   . Takotsubo cardiomyopathy August 2012  . Thyroid goiter    s/p thyroidectomy    Current Outpatient Medications   Medication Sig Dispense Refill  . atorvastatin (LIPITOR) 20 MG tablet Take 20 mg by mouth daily.    . Calcium Carbonate (CALCIUM 600 PO) Take 600 mg by mouth 3 (three) times daily.    . Cholecalciferol (VITAMIN D-3 PO) Take 1 tablet by mouth daily.    . enalapril (VASOTEC) 20 MG tablet Take 20 mg by mouth daily.     . hydrochlorothiazide (HYDRODIURIL) 12.5 MG tablet Take 12.5 mg by mouth daily.    Marland Kitchen levothyroxine (SYNTHROID, LEVOTHROID) 88 MCG tablet Take 88 mcg by mouth daily before breakfast.     . Multiple Vitamins-Minerals (MULTIVITAMIN WITH MINERALS) tablet Take 2 tablets by mouth daily.     . vitamin E 100 UNIT capsule Take 100 Units by mouth daily.       No current facility-administered medications for this encounter.    Allergies  Allergen Reactions  . Sulfa Drugs Cross Reactors Hives    Happened in childhood, believes they were all over the body.      Social History   Socioeconomic History  . Marital status: Divorced    Spouse name: Not on file  . Number of children: Not on file  . Years of education: Not on file  . Highest education level: Not on file  Occupational History  . Not on file  Tobacco Use  . Smoking status: Never Smoker  . Smokeless tobacco: Never Used  Substance and Sexual Activity  . Alcohol use: No  . Drug use: No  . Sexual activity: Not  on file  Other Topics Concern  . Not on file  Social History Narrative  . Not on file   Social Determinants of Health   Financial Resource Strain: Not on file  Food Insecurity: Not on file  Transportation Needs: Not on file  Physical Activity: Not on file  Stress: Not on file  Social Connections: Not on file  Intimate Partner Violence: Not on file      Family History  Problem Relation Age of Onset  . Breast cancer Mother 91  . Cancer Mother        breast  . Arthritis Sister   . Heart failure Father     Vitals:   09/30/20 1456  BP: 130/70  Pulse: 87  SpO2: 98%  Weight: 61.7 kg (136 lb)     Wt Readings from Last 3 Encounters:  09/30/20 61.7 kg (136 lb)  03/12/18 60.3 kg (133 lb)  02/23/18 60.4 kg (133 lb 3.2 oz)    PHYSICAL EXAM: General:  Well appearing. No respiratory difficulty HEENT: Normal, tightening of skin around the mouth.Anicteric Neck: Supple. no JVD. Carotids 2+ bilat; no bruits. No lymphadenopathy or thyromegaly appreciated. Cor: PMI nondisplaced. Regular rate & rhythm. No rubs, gallops or murmurs. NoRVlift.  Lungs: Clear Abdomen: Soft, nontender, nondistended. No hepatosplenomegaly. No bruits or masses. Good bowel sounds. Extremities: No cyanosis, clubbing, rash, or edema. Thick skin, with + sclerodactyly Neuro: Alert & oriented x 3, cranial nerves grossly intact. moves all 4 extremities w/o difficulty. Affect pleasant.  ECG: NSR 91 bpm, personally reviewed.   ASSESSMENT & PLAN:  1. Scleroderma - 02/2018 PFTs with moderately decreased DLCO which is likely consistent with at least mild pulmonary vascular disease. Otherwise normal. -02/2018 ECHO EF 55% with mildly dilated RV. -03/2018 RHC normal with PA = 23/7 (15) -04/2018 HRCT with no ILD, severe tracheobronchomalacia, moderate air trapping consistent with small airways disease - Echo today with EF 65-70%, and normal RV.  -PFT's completed today, results pending - FU 1 year for screening with PFT's/ECHO  2. H/o NICM, suspected Takotsubo - s/p Thyroid surgery in 2012  - Cath 12/2010 with normal coronaries.  - EF had improved to normal 03/2011, but was again depressed at 40-45% 10/2012.  -02/2018 ECHO EF 55% with mildly dilated RV - Echo today normal appearing. Personally reviewed   3. HTN - Well controlled on current meds.   Katherine Roan, MD 09/30/20   Patient seen and examined with the above-signed Advanced Practice Provider and/or Housestaff. I personally reviewed laboratory data, imaging studies and relevant notes. I independently examined the patient and formulated the important aspects  of the plan. I have edited the note to reflect any of my changes or salient points. I have personally discussed the plan with the patient and/or family.  75 y/o women with scleroderma here for yearly screening for PAH.   Has remained stable from functional standpoint. Echo today without any evidence of PAH or RV strain. PFTs show normal spirometry. DLCO stable slightly/improved at 67%. Previous HiRes CT without ILD  General:  Well appearing. No resp difficulty HEENT: normal Neck: supple. no JVD. Carotids 2+ bilat; no bruits. No lymphadenopathy or thryomegaly appreciated. Cor: PMI nondisplaced. Regular rate & rhythm. No rubs, gallops or murmurs. Lungs: clear Abdomen: soft, nontender, nondistended. No hepatosplenomegaly. No bruits or masses. Good bowel sounds. Extremities: no cyanosis, clubbing, rash, edema + skin thickening and scleroderma arthritic changes Neuro: alert & orientedx3, cranial nerves grossly intact. moves all 4 extremities w/o difficulty. Affect  pleasant  Echo and PFTs reviewed personally. No evidence of scleroderma-related PH.We discussed the role of ongoing surveillance. BP looks good.   Glori Bickers, MD  6:24 PM

## 2020-09-30 NOTE — Progress Notes (Signed)
  Echocardiogram 2D Echocardiogram with strain has been performed.  Darlina Sicilian M 09/30/2020, 2:34 PM

## 2020-11-18 ENCOUNTER — Other Ambulatory Visit: Payer: Self-pay | Admitting: Family Medicine

## 2020-11-18 DIAGNOSIS — Z1231 Encounter for screening mammogram for malignant neoplasm of breast: Secondary | ICD-10-CM

## 2020-11-30 DIAGNOSIS — L299 Pruritus, unspecified: Secondary | ICD-10-CM | POA: Diagnosis not present

## 2020-11-30 DIAGNOSIS — L239 Allergic contact dermatitis, unspecified cause: Secondary | ICD-10-CM | POA: Diagnosis not present

## 2021-01-07 ENCOUNTER — Encounter (HOSPITAL_COMMUNITY): Payer: Self-pay | Admitting: *Deleted

## 2021-01-07 DIAGNOSIS — M5431 Sciatica, right side: Secondary | ICD-10-CM | POA: Diagnosis not present

## 2021-01-07 DIAGNOSIS — M353 Polymyalgia rheumatica: Secondary | ICD-10-CM | POA: Diagnosis not present

## 2021-01-07 DIAGNOSIS — M8580 Other specified disorders of bone density and structure, unspecified site: Secondary | ICD-10-CM | POA: Diagnosis not present

## 2021-01-07 DIAGNOSIS — Z6821 Body mass index (BMI) 21.0-21.9, adult: Secondary | ICD-10-CM | POA: Diagnosis not present

## 2021-01-07 DIAGNOSIS — I7300 Raynaud's syndrome without gangrene: Secondary | ICD-10-CM | POA: Diagnosis not present

## 2021-01-07 DIAGNOSIS — M340 Progressive systemic sclerosis: Secondary | ICD-10-CM | POA: Diagnosis not present

## 2021-01-07 DIAGNOSIS — R918 Other nonspecific abnormal finding of lung field: Secondary | ICD-10-CM | POA: Diagnosis not present

## 2021-01-07 NOTE — Progress Notes (Signed)
Received signed ROI from [place] requesting pt's records. OV note, echo and pft's faxed to them.

## 2021-01-13 ENCOUNTER — Ambulatory Visit
Admission: RE | Admit: 2021-01-13 | Discharge: 2021-01-13 | Disposition: A | Discharge status: Home / Self Care | Payer: Medicare HMO | Source: Ambulatory Visit | Attending: Family Medicine | Admitting: Family Medicine

## 2021-01-13 ENCOUNTER — Other Ambulatory Visit: Payer: Self-pay

## 2021-01-13 DIAGNOSIS — Z1231 Encounter for screening mammogram for malignant neoplasm of breast: Secondary | ICD-10-CM | POA: Diagnosis not present

## 2021-01-15 DIAGNOSIS — E785 Hyperlipidemia, unspecified: Secondary | ICD-10-CM | POA: Diagnosis not present

## 2021-01-15 DIAGNOSIS — N183 Chronic kidney disease, stage 3 unspecified: Secondary | ICD-10-CM | POA: Diagnosis not present

## 2021-01-15 DIAGNOSIS — I129 Hypertensive chronic kidney disease with stage 1 through stage 4 chronic kidney disease, or unspecified chronic kidney disease: Secondary | ICD-10-CM | POA: Diagnosis not present

## 2021-01-15 DIAGNOSIS — I10 Essential (primary) hypertension: Secondary | ICD-10-CM | POA: Diagnosis not present

## 2021-01-15 DIAGNOSIS — E890 Postprocedural hypothyroidism: Secondary | ICD-10-CM | POA: Diagnosis not present

## 2021-02-01 DIAGNOSIS — Z0000 Encounter for general adult medical examination without abnormal findings: Secondary | ICD-10-CM | POA: Diagnosis not present

## 2021-02-01 DIAGNOSIS — M8588 Other specified disorders of bone density and structure, other site: Secondary | ICD-10-CM | POA: Diagnosis not present

## 2021-02-01 DIAGNOSIS — N183 Chronic kidney disease, stage 3 unspecified: Secondary | ICD-10-CM | POA: Diagnosis not present

## 2021-02-01 DIAGNOSIS — E785 Hyperlipidemia, unspecified: Secondary | ICD-10-CM | POA: Diagnosis not present

## 2021-02-01 DIAGNOSIS — I129 Hypertensive chronic kidney disease with stage 1 through stage 4 chronic kidney disease, or unspecified chronic kidney disease: Secondary | ICD-10-CM | POA: Diagnosis not present

## 2021-02-01 DIAGNOSIS — E890 Postprocedural hypothyroidism: Secondary | ICD-10-CM | POA: Diagnosis not present

## 2021-02-01 DIAGNOSIS — R7303 Prediabetes: Secondary | ICD-10-CM | POA: Diagnosis not present

## 2021-02-01 DIAGNOSIS — Z23 Encounter for immunization: Secondary | ICD-10-CM | POA: Diagnosis not present

## 2021-02-25 DIAGNOSIS — E119 Type 2 diabetes mellitus without complications: Secondary | ICD-10-CM | POA: Diagnosis not present

## 2021-02-25 DIAGNOSIS — H2513 Age-related nuclear cataract, bilateral: Secondary | ICD-10-CM | POA: Diagnosis not present

## 2021-03-15 DIAGNOSIS — I10 Essential (primary) hypertension: Secondary | ICD-10-CM | POA: Diagnosis not present

## 2021-03-15 DIAGNOSIS — E785 Hyperlipidemia, unspecified: Secondary | ICD-10-CM | POA: Diagnosis not present

## 2021-03-15 DIAGNOSIS — N183 Chronic kidney disease, stage 3 unspecified: Secondary | ICD-10-CM | POA: Diagnosis not present

## 2021-03-15 DIAGNOSIS — E890 Postprocedural hypothyroidism: Secondary | ICD-10-CM | POA: Diagnosis not present

## 2021-03-15 DIAGNOSIS — I129 Hypertensive chronic kidney disease with stage 1 through stage 4 chronic kidney disease, or unspecified chronic kidney disease: Secondary | ICD-10-CM | POA: Diagnosis not present

## 2021-07-01 DIAGNOSIS — I129 Hypertensive chronic kidney disease with stage 1 through stage 4 chronic kidney disease, or unspecified chronic kidney disease: Secondary | ICD-10-CM | POA: Diagnosis not present

## 2021-07-01 DIAGNOSIS — E785 Hyperlipidemia, unspecified: Secondary | ICD-10-CM | POA: Diagnosis not present

## 2021-07-01 DIAGNOSIS — N183 Chronic kidney disease, stage 3 unspecified: Secondary | ICD-10-CM | POA: Diagnosis not present

## 2021-07-01 DIAGNOSIS — E890 Postprocedural hypothyroidism: Secondary | ICD-10-CM | POA: Diagnosis not present

## 2021-07-08 DIAGNOSIS — I7300 Raynaud's syndrome without gangrene: Secondary | ICD-10-CM | POA: Diagnosis not present

## 2021-07-08 DIAGNOSIS — M8580 Other specified disorders of bone density and structure, unspecified site: Secondary | ICD-10-CM | POA: Diagnosis not present

## 2021-07-08 DIAGNOSIS — M340 Progressive systemic sclerosis: Secondary | ICD-10-CM | POA: Diagnosis not present

## 2021-07-08 DIAGNOSIS — I509 Heart failure, unspecified: Secondary | ICD-10-CM | POA: Diagnosis not present

## 2021-07-08 DIAGNOSIS — M5431 Sciatica, right side: Secondary | ICD-10-CM | POA: Diagnosis not present

## 2021-07-08 DIAGNOSIS — M353 Polymyalgia rheumatica: Secondary | ICD-10-CM | POA: Diagnosis not present

## 2021-07-08 DIAGNOSIS — R918 Other nonspecific abnormal finding of lung field: Secondary | ICD-10-CM | POA: Diagnosis not present

## 2021-07-08 DIAGNOSIS — R29898 Other symptoms and signs involving the musculoskeletal system: Secondary | ICD-10-CM | POA: Diagnosis not present

## 2021-07-08 DIAGNOSIS — L03114 Cellulitis of left upper limb: Secondary | ICD-10-CM | POA: Diagnosis not present

## 2021-07-08 DIAGNOSIS — Z6821 Body mass index (BMI) 21.0-21.9, adult: Secondary | ICD-10-CM | POA: Diagnosis not present

## 2021-08-02 DIAGNOSIS — M349 Systemic sclerosis, unspecified: Secondary | ICD-10-CM | POA: Diagnosis not present

## 2021-08-02 DIAGNOSIS — N183 Chronic kidney disease, stage 3 unspecified: Secondary | ICD-10-CM | POA: Diagnosis not present

## 2021-08-02 DIAGNOSIS — E890 Postprocedural hypothyroidism: Secondary | ICD-10-CM | POA: Diagnosis not present

## 2021-08-02 DIAGNOSIS — I129 Hypertensive chronic kidney disease with stage 1 through stage 4 chronic kidney disease, or unspecified chronic kidney disease: Secondary | ICD-10-CM | POA: Diagnosis not present

## 2021-08-02 DIAGNOSIS — R7303 Prediabetes: Secondary | ICD-10-CM | POA: Diagnosis not present

## 2021-08-02 DIAGNOSIS — E785 Hyperlipidemia, unspecified: Secondary | ICD-10-CM | POA: Diagnosis not present

## 2021-09-23 DIAGNOSIS — L309 Dermatitis, unspecified: Secondary | ICD-10-CM | POA: Diagnosis not present

## 2021-12-06 ENCOUNTER — Other Ambulatory Visit: Payer: Self-pay | Admitting: Family Medicine

## 2021-12-06 DIAGNOSIS — Z1231 Encounter for screening mammogram for malignant neoplasm of breast: Secondary | ICD-10-CM

## 2022-01-13 DIAGNOSIS — Z6821 Body mass index (BMI) 21.0-21.9, adult: Secondary | ICD-10-CM | POA: Diagnosis not present

## 2022-01-13 DIAGNOSIS — I7300 Raynaud's syndrome without gangrene: Secondary | ICD-10-CM | POA: Diagnosis not present

## 2022-01-13 DIAGNOSIS — M340 Progressive systemic sclerosis: Secondary | ICD-10-CM | POA: Diagnosis not present

## 2022-01-13 DIAGNOSIS — R918 Other nonspecific abnormal finding of lung field: Secondary | ICD-10-CM | POA: Diagnosis not present

## 2022-01-13 DIAGNOSIS — M8580 Other specified disorders of bone density and structure, unspecified site: Secondary | ICD-10-CM | POA: Diagnosis not present

## 2022-01-13 DIAGNOSIS — M5431 Sciatica, right side: Secondary | ICD-10-CM | POA: Diagnosis not present

## 2022-01-13 DIAGNOSIS — M353 Polymyalgia rheumatica: Secondary | ICD-10-CM | POA: Diagnosis not present

## 2022-01-13 DIAGNOSIS — L03114 Cellulitis of left upper limb: Secondary | ICD-10-CM | POA: Diagnosis not present

## 2022-01-14 ENCOUNTER — Ambulatory Visit
Admission: RE | Admit: 2022-01-14 | Discharge: 2022-01-14 | Disposition: A | Discharge status: Home / Self Care | Payer: Medicare HMO | Source: Ambulatory Visit | Attending: Family Medicine | Admitting: Family Medicine

## 2022-01-14 DIAGNOSIS — Z1231 Encounter for screening mammogram for malignant neoplasm of breast: Secondary | ICD-10-CM

## 2022-02-07 DIAGNOSIS — M349 Systemic sclerosis, unspecified: Secondary | ICD-10-CM | POA: Diagnosis not present

## 2022-02-07 DIAGNOSIS — M8588 Other specified disorders of bone density and structure, other site: Secondary | ICD-10-CM | POA: Diagnosis not present

## 2022-02-07 DIAGNOSIS — R7303 Prediabetes: Secondary | ICD-10-CM | POA: Diagnosis not present

## 2022-02-07 DIAGNOSIS — Z23 Encounter for immunization: Secondary | ICD-10-CM | POA: Diagnosis not present

## 2022-02-07 DIAGNOSIS — Z0000 Encounter for general adult medical examination without abnormal findings: Secondary | ICD-10-CM | POA: Diagnosis not present

## 2022-02-07 DIAGNOSIS — E890 Postprocedural hypothyroidism: Secondary | ICD-10-CM | POA: Diagnosis not present

## 2022-02-07 DIAGNOSIS — I129 Hypertensive chronic kidney disease with stage 1 through stage 4 chronic kidney disease, or unspecified chronic kidney disease: Secondary | ICD-10-CM | POA: Diagnosis not present

## 2022-02-07 DIAGNOSIS — E785 Hyperlipidemia, unspecified: Secondary | ICD-10-CM | POA: Diagnosis not present

## 2022-02-07 DIAGNOSIS — N183 Chronic kidney disease, stage 3 unspecified: Secondary | ICD-10-CM | POA: Diagnosis not present

## 2022-02-08 ENCOUNTER — Other Ambulatory Visit: Payer: Self-pay | Admitting: Family Medicine

## 2022-02-08 DIAGNOSIS — M8580 Other specified disorders of bone density and structure, unspecified site: Secondary | ICD-10-CM

## 2022-03-16 DIAGNOSIS — H5201 Hypermetropia, right eye: Secondary | ICD-10-CM | POA: Diagnosis not present

## 2022-03-16 DIAGNOSIS — H2513 Age-related nuclear cataract, bilateral: Secondary | ICD-10-CM | POA: Diagnosis not present

## 2022-03-16 DIAGNOSIS — E119 Type 2 diabetes mellitus without complications: Secondary | ICD-10-CM | POA: Diagnosis not present

## 2022-03-16 DIAGNOSIS — H52223 Regular astigmatism, bilateral: Secondary | ICD-10-CM | POA: Diagnosis not present

## 2022-05-03 ENCOUNTER — Other Ambulatory Visit (HOSPITAL_COMMUNITY): Payer: Self-pay | Admitting: *Deleted

## 2022-05-03 DIAGNOSIS — M349 Systemic sclerosis, unspecified: Secondary | ICD-10-CM

## 2022-05-13 ENCOUNTER — Telehealth (HOSPITAL_COMMUNITY): Payer: Self-pay | Admitting: Vascular Surgery

## 2022-05-13 NOTE — Telephone Encounter (Signed)
Lvm giving PFT,ECHO and 1 yr f/u w/ db 2/15 asked pt to call back to confirm

## 2022-06-23 ENCOUNTER — Other Ambulatory Visit (HOSPITAL_COMMUNITY): Payer: Medicare HMO

## 2022-06-23 ENCOUNTER — Encounter (HOSPITAL_COMMUNITY): Payer: Medicare HMO | Admitting: Internal Medicine

## 2022-06-23 ENCOUNTER — Encounter (HOSPITAL_COMMUNITY): Payer: Medicare HMO

## 2022-06-30 ENCOUNTER — Ambulatory Visit (HOSPITAL_COMMUNITY)
Admission: RE | Admit: 2022-06-30 | Discharge: 2022-06-30 | Disposition: A | Discharge status: Home / Self Care | Payer: Medicare HMO | Source: Ambulatory Visit | Attending: Internal Medicine | Admitting: Internal Medicine

## 2022-06-30 ENCOUNTER — Ambulatory Visit (HOSPITAL_BASED_OUTPATIENT_CLINIC_OR_DEPARTMENT_OTHER)
Admission: RE | Admit: 2022-06-30 | Discharge: 2022-06-30 | Disposition: A | Discharge status: Home / Self Care | Payer: Medicare HMO | Source: Ambulatory Visit | Attending: Internal Medicine | Admitting: Internal Medicine

## 2022-06-30 DIAGNOSIS — M349 Systemic sclerosis, unspecified: Secondary | ICD-10-CM | POA: Diagnosis not present

## 2022-06-30 DIAGNOSIS — R942 Abnormal results of pulmonary function studies: Secondary | ICD-10-CM | POA: Diagnosis not present

## 2022-06-30 LAB — ECHOCARDIOGRAM COMPLETE
AR max vel: 2.19 cm2
AV Area VTI: 2.23 cm2
AV Area mean vel: 2.07 cm2
AV Mean grad: 2 mmHg
AV Peak grad: 4.5 mmHg
Ao pk vel: 1.06 m/s
Area-P 1/2: 3.77 cm2
Calc EF: 42.4 %
S' Lateral: 2.5 cm
Single Plane A2C EF: 37.2 %
Single Plane A4C EF: 48.2 %

## 2022-06-30 LAB — PULMONARY FUNCTION TEST
DL/VA % pred: 68 %
DL/VA: 2.78 ml/min/mmHg/L
DLCO unc % pred: 63 %
DLCO unc: 12.44 ml/min/mmHg
FEF 25-75 Pre: 3.04 L/sec
FEF2575-%Pred-Pre: 188 %
FEV1-%Pred-Pre: 131 %
FEV1-Pre: 2.81 L
FEV1FVC-%Pred-Pre: 110 %
FEV6-%Pred-Pre: 125 %
FEV6-Pre: 3.4 L
FEV6FVC-%Pred-Pre: 105 %
FVC-%Pred-Pre: 119 %
FVC-Pre: 3.42 L
Pre FEV1/FVC ratio: 82 %
Pre FEV6/FVC Ratio: 100 %
RV % pred: 100 %
RV: 2.4 L
TLC % pred: 113 %
TLC: 5.9 L

## 2022-07-14 ENCOUNTER — Ambulatory Visit (HOSPITAL_COMMUNITY)
Admission: RE | Admit: 2022-07-14 | Discharge: 2022-07-14 | Disposition: A | Discharge status: Home / Self Care | Payer: Medicare HMO | Source: Ambulatory Visit | Attending: Internal Medicine | Admitting: Internal Medicine

## 2022-07-14 VITALS — BP 105/80 | HR 92 | Wt 126.0 lb

## 2022-07-14 DIAGNOSIS — Z79899 Other long term (current) drug therapy: Secondary | ICD-10-CM | POA: Diagnosis not present

## 2022-07-14 DIAGNOSIS — M349 Systemic sclerosis, unspecified: Secondary | ICD-10-CM | POA: Diagnosis not present

## 2022-07-14 DIAGNOSIS — E119 Type 2 diabetes mellitus without complications: Secondary | ICD-10-CM | POA: Diagnosis not present

## 2022-07-14 DIAGNOSIS — I2510 Atherosclerotic heart disease of native coronary artery without angina pectoris: Secondary | ICD-10-CM | POA: Diagnosis not present

## 2022-07-14 DIAGNOSIS — I5181 Takotsubo syndrome: Secondary | ICD-10-CM | POA: Diagnosis not present

## 2022-07-14 DIAGNOSIS — I10 Essential (primary) hypertension: Secondary | ICD-10-CM | POA: Diagnosis not present

## 2022-07-14 NOTE — Patient Instructions (Signed)
There has been no changes to your medications.  Your physician has requested that you have an echocardiogram. Echocardiography is a painless test that uses sound waves to create images of your heart. It provides your doctor with information about the size and shape of your heart and how well your heart's chambers and valves are working. This procedure takes approximately one hour. There are no restrictions for this procedure. Please do NOT wear cologne, perfume, aftershave, or lotions (deodorant is allowed). Please arrive 15 minutes prior to your appointment time.  Your physician has recommended that you have a pulmonary function test. Pulmonary Function Tests are a group of tests that measure how well air moves in and out of your lungs.  Your physician recommends that you schedule a follow-up appointment in: 18 months with an echocardiogram and PFT's (September 2025)  ** please call the office in June 2025 to arrange your follow up appointment. **  If you have any questions or concerns before your next appointment please send [place] a message through [place] or call our office at [phone].    TO LEAVE A MESSAGE FOR THE NURSE SELECT OPTION 2, PLEASE LEAVE A MESSAGE INCLUDING: YOUR NAME DATE OF BIRTH CALL BACK NUMBER REASON FOR CALL**this is important as we prioritize the call backs  YOU WILL RECEIVE A CALL BACK THE SAME DAY AS LONG AS YOU CALL BEFORE 4:00 PM  At the [facility], you and your health needs are our priority. As part of our continuing mission to provide you with exceptional heart care, we have created designated Provider Care Teams. These Care Teams include your primary Cardiologist (physician) and Advanced Practice Providers (APPs- Physician Assistants and Nurse Practitioners) who all work together to provide you with the care you need, when you need it.   You may see any of the following providers on your designated Care Team at your next follow up: Dr Glori Bickers Dr Loralie Champagne Dr. Roxana Hires, NP Lyda Jester, [place] [facility], [place] Forestine Na, NP Audry Riles, PharmD   Please be sure to bring in all your medications bottles to every appointment.    Thank you for choosing [facility]

## 2022-07-14 NOTE — Progress Notes (Addendum)
Advanced Heart Failure Clinic Note   Referring Physician: Dr. Amil Amen PCP: Harlan Stains, MD  Endo: Dr. Debbora Presto PCP-Cardiologist: Dr. Martinique  HPI:  Kaitlyn Allen is a 77 y.o. female with scleroderma and h/o Takotsubo cardiomyopathy referred by Dr. Amil Amen for pulmonary hypertension screening.   She has distant history of Takotsubo CMP related to acute distress from a thyroid surgery, and was followed transiently by Dr. Martinique. Last seen 2013.  Echo 12/2010 LVEF 25-30%, in setting of recent thyroid surgery. HK of distal inferoseptal, mid/distal lateral, mid/distal anterior, distal inferior, mid/distal posterior and apical walls.  Echo 03/2011 LVEF 55-60% Echo 10/2012 showed LVEF 40-45%  HiresCT 12/19 No ILD + tracheobronchomalacia + 3v coronary calcium  PFT 09/2014 FVC 3.6 (110%), FEV1 3.44 (116), DLCO 15.52 (60%)  PFTs 02/23/18 shows FVC 3.22 (105), FEV1 2.61 (113%) DLCO 15.30 (59%) Echo 02/23/18 EF 55% with mildly dilated RV.   [place] 11/19 RA = 4 RV = 21/4 PA = 23/7 (15) PCW = 6 Fick cardiac output/index  4.5/2.8 PVR = 2.0 WU Ao sat = 98% PA sat = 67%,68%  ECHO 09/30/20 EF 65-70%, normal RV, no TR, no evidence of PH or RV strain PFT's  09/30/20  FEV1 2.59 (117%) FVC 3.22 (109%) DLCO 71%  Echo 2/24 EF 55-60% ? Mild RV diation. Function normal   PFTs 2/24 FEV1 2.81 (131%) FVC 3.42 (119%) DLCO 68%  Initially established with me 02/2018 for pulmonary hypertension screening in the setting of Scleroderma. Diagnosed at [facility] years ago. Dr Amil Amen follows her for her Scleroderma.  We arranged RHC in 11/19 which demonstrated normal findings overall PA = 23/7 (15)  Here for routine f/u. Going to [employer] 2x/week and [employer] 1x/week. Denies SOB, edema, dizziness.   Social: Retired Control and instrumentation engineer.     Past Medical History:  Diagnosis Date   Cancer [facility])    thyroid   Diabetes mellitus    Hyperlipidemia    Hypertension    NSTEMI (non-ST elevated  myocardial infarction) [facility]) August 2012   Normal coronaries per cath with moderate LV dysfunction. EF 40%   Osteopenia    Scleroderma ([place])    Scoliosis    Takotsubo cardiomyopathy August 2012   Thyroid goiter    s/p thyroidectomy    Current Outpatient Medications  Medication Sig Dispense Refill   atorvastatin (LIPITOR) 20 MG tablet Take 20 mg by mouth daily.     Calcium Carbonate (CALCIUM 600 PO) Take 600 mg by mouth 3 (three) times daily.     Cholecalciferol (VITAMIN D-3 PO) Take 1 tablet by mouth daily.     enalapril (VASOTEC) 20 MG tablet Take 20 mg by mouth daily.      hydrochlorothiazide (HYDRODIURIL) 12.5 MG tablet Take 12.5 mg by mouth daily.     levothyroxine (SYNTHROID, LEVOTHROID) 88 MCG tablet Take 88 mcg by mouth daily before breakfast.      Multiple Vitamins-Minerals (MULTIVITAMIN WITH MINERALS) tablet Take 2 tablets by mouth daily.      Omega-3 Fatty Acids (FISH OIL) 300 MG CAPS Take 300 mg by mouth daily.     vitamin E 100 UNIT capsule Take 100 Units by mouth daily.       No current facility-administered medications for this encounter.    Allergies  Allergen Reactions   Sulfa Drugs Cross Reactors Hives    Happened in childhood, believes they were all over the body.      Social History   Socioeconomic History   Marital status: Divorced  Spouse name: Not on file   Number of children: Not on file   Years of education: Not on file   Highest education level: Not on file  Occupational History   Not on file  Tobacco Use   Smoking status: Never   Smokeless tobacco: Never  Substance and Sexual Activity   Alcohol use: No   Drug use: No   Sexual activity: Not on file  Other Topics Concern   Not on file  Social History Narrative   Not on file   Social Determinants of Health   Financial Resource Strain: Not on file  Food Insecurity: Not on file  Transportation Needs: Not on file  Physical Activity: Not on file  Stress: Not on file  Social  Connections: Not on file  Intimate Partner Violence: Not on file      Family History  Problem Relation Age of Onset   Breast cancer Mother 60   Cancer Mother        breast   Arthritis Sister    Heart failure Father     Vitals:   07/14/22 1438  BP: 105/80  Pulse: 92  SpO2: 97%  Weight: 57.2 kg (126 lb)    Wt Readings from Last 3 Encounters:  07/14/22 57.2 kg (126 lb)  09/30/20 61.7 kg (136 lb)  03/12/18 60.3 kg (133 lb)    PHYSICAL EXAM: General:  Well appearing. No resp difficulty HEENT: normal Neck: supple. no JVD. Carotids 2+ bilat; no bruits. No lymphadenopathy or thryomegaly appreciated. Cor: PMI nondisplaced. Regular rate & rhythm. No rubs, gallops or murmurs. Lungs: clear Abdomen: soft, nontender, nondistended. No hepatosplenomegaly. No bruits or masses. Good bowel sounds. Extremities: no cyanosis, clubbing, edema + skin thickening/Scleradactyly Neuro: alert & orientedx3, cranial nerves grossly intact. moves all 4 extremities w/o difficulty. Affect pleasant   ECG: NSR 95 bpm, personally reviewed.   ASSESSMENT & PLAN:  1. Scleroderma - 02/2018 PFTs with moderately decreased DLCO which is likely consistent with at least mild pulmonary vascular disease. Otherwise normal. -02/2018 ECHO EF 55% with mildly dilated RV. -03/2018 RHC normal with PA = 23/7 (15) -04/2018 HRCT with no ILD, severe tracheobronchomalacia, moderate air trapping consistent with small airways disease - Echo 5/22 EF 65-70%, and normal RV.  - Echo 06/30/22 EF 55-60% normal RV - PFTs 2/24 DLCO up to 68% - Overall stable. No indication of developing PAH or ILD. Previous RHC and hi -RES CT reviewed persoanlly.   2. H/o NICM, suspected Takotsubo - s/p Thyroid surgery in 2012  - Cath 12/2010 with normal coronaries.  - EF had improved to normal 03/2011, but was again depressed at 40-45% 10/2012.  -02/2018 ECHO EF 55% with mildly dilated RV - EF remains normal  3. HTN - Well controlled on current  meds.  4. 3v coronary calcium - on statin  - no angina - goal LDL < 70 - followed by PCP  5. DM2 - With coronary Ca and DM2 can consider Jardiance   Glori Bickers, MD  7:52 AM

## 2022-07-18 DIAGNOSIS — Z6820 Body mass index (BMI) 20.0-20.9, adult: Secondary | ICD-10-CM | POA: Diagnosis not present

## 2022-07-18 DIAGNOSIS — M8580 Other specified disorders of bone density and structure, unspecified site: Secondary | ICD-10-CM | POA: Diagnosis not present

## 2022-07-18 DIAGNOSIS — M5431 Sciatica, right side: Secondary | ICD-10-CM | POA: Diagnosis not present

## 2022-07-18 DIAGNOSIS — I509 Heart failure, unspecified: Secondary | ICD-10-CM | POA: Diagnosis not present

## 2022-07-18 DIAGNOSIS — L03114 Cellulitis of left upper limb: Secondary | ICD-10-CM | POA: Diagnosis not present

## 2022-07-18 DIAGNOSIS — M353 Polymyalgia rheumatica: Secondary | ICD-10-CM | POA: Diagnosis not present

## 2022-07-18 DIAGNOSIS — I7300 Raynaud's syndrome without gangrene: Secondary | ICD-10-CM | POA: Diagnosis not present

## 2022-07-18 DIAGNOSIS — R918 Other nonspecific abnormal finding of lung field: Secondary | ICD-10-CM | POA: Diagnosis not present

## 2022-07-18 DIAGNOSIS — M340 Progressive systemic sclerosis: Secondary | ICD-10-CM | POA: Diagnosis not present

## 2022-08-12 DIAGNOSIS — M349 Systemic sclerosis, unspecified: Secondary | ICD-10-CM | POA: Diagnosis not present

## 2022-08-12 DIAGNOSIS — N183 Chronic kidney disease, stage 3 unspecified: Secondary | ICD-10-CM | POA: Diagnosis not present

## 2022-08-12 DIAGNOSIS — R8290 Unspecified abnormal findings in urine: Secondary | ICD-10-CM | POA: Diagnosis not present

## 2022-08-12 DIAGNOSIS — R7303 Prediabetes: Secondary | ICD-10-CM | POA: Diagnosis not present

## 2022-08-12 DIAGNOSIS — E785 Hyperlipidemia, unspecified: Secondary | ICD-10-CM | POA: Diagnosis not present

## 2022-08-12 DIAGNOSIS — I129 Hypertensive chronic kidney disease with stage 1 through stage 4 chronic kidney disease, or unspecified chronic kidney disease: Secondary | ICD-10-CM | POA: Diagnosis not present

## 2022-08-12 DIAGNOSIS — E890 Postprocedural hypothyroidism: Secondary | ICD-10-CM | POA: Diagnosis not present

## 2022-09-09 ENCOUNTER — Ambulatory Visit
Admission: RE | Admit: 2022-09-09 | Discharge: 2022-09-09 | Disposition: A | Discharge status: Home / Self Care | Payer: Medicare HMO | Source: Ambulatory Visit | Attending: Family Medicine | Admitting: Family Medicine

## 2022-09-09 DIAGNOSIS — Z13820 Encounter for screening for osteoporosis: Secondary | ICD-10-CM | POA: Diagnosis not present

## 2022-09-09 DIAGNOSIS — E349 Endocrine disorder, unspecified: Secondary | ICD-10-CM | POA: Diagnosis not present

## 2022-09-09 DIAGNOSIS — R29890 Loss of height: Secondary | ICD-10-CM | POA: Diagnosis not present

## 2022-09-09 DIAGNOSIS — M810 Age-related osteoporosis without current pathological fracture: Secondary | ICD-10-CM | POA: Diagnosis not present

## 2022-09-09 DIAGNOSIS — N951 Menopausal and female climacteric states: Secondary | ICD-10-CM | POA: Diagnosis not present

## 2022-09-09 DIAGNOSIS — M8580 Other specified disorders of bone density and structure, unspecified site: Secondary | ICD-10-CM

## 2022-09-17 DIAGNOSIS — L255 Unspecified contact dermatitis due to plants, except food: Secondary | ICD-10-CM | POA: Diagnosis not present

## 2022-12-05 ENCOUNTER — Other Ambulatory Visit: Payer: Self-pay | Admitting: Family Medicine

## 2022-12-05 DIAGNOSIS — Z0000 Encounter for general adult medical examination without abnormal findings: Secondary | ICD-10-CM

## 2023-01-13 DIAGNOSIS — M353 Polymyalgia rheumatica: Secondary | ICD-10-CM | POA: Diagnosis not present

## 2023-01-13 DIAGNOSIS — L03114 Cellulitis of left upper limb: Secondary | ICD-10-CM | POA: Diagnosis not present

## 2023-01-13 DIAGNOSIS — M5431 Sciatica, right side: Secondary | ICD-10-CM | POA: Diagnosis not present

## 2023-01-13 DIAGNOSIS — I7300 Raynaud's syndrome without gangrene: Secondary | ICD-10-CM | POA: Diagnosis not present

## 2023-01-13 DIAGNOSIS — M340 Progressive systemic sclerosis: Secondary | ICD-10-CM | POA: Diagnosis not present

## 2023-01-13 DIAGNOSIS — M8580 Other specified disorders of bone density and structure, unspecified site: Secondary | ICD-10-CM | POA: Diagnosis not present

## 2023-01-13 DIAGNOSIS — Z6820 Body mass index (BMI) 20.0-20.9, adult: Secondary | ICD-10-CM | POA: Diagnosis not present

## 2023-01-13 DIAGNOSIS — R918 Other nonspecific abnormal finding of lung field: Secondary | ICD-10-CM | POA: Diagnosis not present

## 2023-01-16 ENCOUNTER — Ambulatory Visit: Payer: Medicare HMO

## 2023-01-22 DIAGNOSIS — M7632 Iliotibial band syndrome, left leg: Secondary | ICD-10-CM | POA: Diagnosis not present

## 2023-01-25 DIAGNOSIS — M79604 Pain in right leg: Secondary | ICD-10-CM | POA: Diagnosis not present

## 2023-01-25 DIAGNOSIS — M6281 Muscle weakness (generalized): Secondary | ICD-10-CM | POA: Diagnosis not present

## 2023-01-25 DIAGNOSIS — M5459 Other low back pain: Secondary | ICD-10-CM | POA: Diagnosis not present

## 2023-01-25 DIAGNOSIS — R262 Difficulty in walking, not elsewhere classified: Secondary | ICD-10-CM | POA: Diagnosis not present

## 2023-01-25 DIAGNOSIS — M79605 Pain in left leg: Secondary | ICD-10-CM | POA: Diagnosis not present

## 2023-01-31 DIAGNOSIS — M79605 Pain in left leg: Secondary | ICD-10-CM | POA: Diagnosis not present

## 2023-01-31 DIAGNOSIS — M5459 Other low back pain: Secondary | ICD-10-CM | POA: Diagnosis not present

## 2023-01-31 DIAGNOSIS — M79604 Pain in right leg: Secondary | ICD-10-CM | POA: Diagnosis not present

## 2023-01-31 DIAGNOSIS — R262 Difficulty in walking, not elsewhere classified: Secondary | ICD-10-CM | POA: Diagnosis not present

## 2023-01-31 DIAGNOSIS — M6281 Muscle weakness (generalized): Secondary | ICD-10-CM | POA: Diagnosis not present

## 2023-02-02 ENCOUNTER — Ambulatory Visit
Admission: RE | Admit: 2023-02-02 | Discharge: 2023-02-02 | Disposition: A | Discharge status: Home / Self Care | Payer: Medicare HMO | Source: Ambulatory Visit | Attending: Family Medicine | Admitting: Family Medicine

## 2023-02-02 DIAGNOSIS — Z0000 Encounter for general adult medical examination without abnormal findings: Secondary | ICD-10-CM

## 2023-02-02 DIAGNOSIS — Z1231 Encounter for screening mammogram for malignant neoplasm of breast: Secondary | ICD-10-CM | POA: Diagnosis not present

## 2023-02-03 DIAGNOSIS — M79605 Pain in left leg: Secondary | ICD-10-CM | POA: Diagnosis not present

## 2023-02-03 DIAGNOSIS — R262 Difficulty in walking, not elsewhere classified: Secondary | ICD-10-CM | POA: Diagnosis not present

## 2023-02-03 DIAGNOSIS — M5459 Other low back pain: Secondary | ICD-10-CM | POA: Diagnosis not present

## 2023-02-03 DIAGNOSIS — M79604 Pain in right leg: Secondary | ICD-10-CM | POA: Diagnosis not present

## 2023-02-03 DIAGNOSIS — M6281 Muscle weakness (generalized): Secondary | ICD-10-CM | POA: Diagnosis not present

## 2023-02-07 DIAGNOSIS — R262 Difficulty in walking, not elsewhere classified: Secondary | ICD-10-CM | POA: Diagnosis not present

## 2023-02-07 DIAGNOSIS — M5459 Other low back pain: Secondary | ICD-10-CM | POA: Diagnosis not present

## 2023-02-07 DIAGNOSIS — M79604 Pain in right leg: Secondary | ICD-10-CM | POA: Diagnosis not present

## 2023-02-07 DIAGNOSIS — M6281 Muscle weakness (generalized): Secondary | ICD-10-CM | POA: Diagnosis not present

## 2023-02-07 DIAGNOSIS — M79605 Pain in left leg: Secondary | ICD-10-CM | POA: Diagnosis not present

## 2023-02-14 DIAGNOSIS — M79651 Pain in right thigh: Secondary | ICD-10-CM | POA: Diagnosis not present

## 2023-02-14 DIAGNOSIS — M47816 Spondylosis without myelopathy or radiculopathy, lumbar region: Secondary | ICD-10-CM | POA: Diagnosis not present

## 2023-02-14 DIAGNOSIS — M79652 Pain in left thigh: Secondary | ICD-10-CM | POA: Diagnosis not present

## 2023-02-14 DIAGNOSIS — R9389 Abnormal findings on diagnostic imaging of other specified body structures: Secondary | ICD-10-CM | POA: Diagnosis not present

## 2023-02-14 DIAGNOSIS — M79605 Pain in left leg: Secondary | ICD-10-CM | POA: Diagnosis not present

## 2023-02-14 DIAGNOSIS — M79604 Pain in right leg: Secondary | ICD-10-CM | POA: Diagnosis not present

## 2023-02-14 DIAGNOSIS — M6281 Muscle weakness (generalized): Secondary | ICD-10-CM | POA: Diagnosis not present

## 2023-02-14 DIAGNOSIS — M5459 Other low back pain: Secondary | ICD-10-CM | POA: Diagnosis not present

## 2023-02-14 DIAGNOSIS — R262 Difficulty in walking, not elsewhere classified: Secondary | ICD-10-CM | POA: Diagnosis not present

## 2023-02-14 DIAGNOSIS — M79659 Pain in unspecified thigh: Secondary | ICD-10-CM | POA: Diagnosis not present

## 2023-02-16 DIAGNOSIS — M810 Age-related osteoporosis without current pathological fracture: Secondary | ICD-10-CM | POA: Diagnosis not present

## 2023-02-16 DIAGNOSIS — Z0000 Encounter for general adult medical examination without abnormal findings: Secondary | ICD-10-CM | POA: Diagnosis not present

## 2023-02-16 DIAGNOSIS — Z23 Encounter for immunization: Secondary | ICD-10-CM | POA: Diagnosis not present

## 2023-02-16 DIAGNOSIS — E785 Hyperlipidemia, unspecified: Secondary | ICD-10-CM | POA: Diagnosis not present

## 2023-02-16 DIAGNOSIS — D649 Anemia, unspecified: Secondary | ICD-10-CM | POA: Diagnosis not present

## 2023-02-16 DIAGNOSIS — N183 Chronic kidney disease, stage 3 unspecified: Secondary | ICD-10-CM | POA: Diagnosis not present

## 2023-02-16 DIAGNOSIS — M349 Systemic sclerosis, unspecified: Secondary | ICD-10-CM | POA: Diagnosis not present

## 2023-02-16 DIAGNOSIS — M8588 Other specified disorders of bone density and structure, other site: Secondary | ICD-10-CM | POA: Diagnosis not present

## 2023-02-16 DIAGNOSIS — I129 Hypertensive chronic kidney disease with stage 1 through stage 4 chronic kidney disease, or unspecified chronic kidney disease: Secondary | ICD-10-CM | POA: Diagnosis not present

## 2023-02-16 DIAGNOSIS — R7303 Prediabetes: Secondary | ICD-10-CM | POA: Diagnosis not present

## 2023-02-16 DIAGNOSIS — E890 Postprocedural hypothyroidism: Secondary | ICD-10-CM | POA: Diagnosis not present

## 2023-02-17 DIAGNOSIS — R262 Difficulty in walking, not elsewhere classified: Secondary | ICD-10-CM | POA: Diagnosis not present

## 2023-02-17 DIAGNOSIS — M79604 Pain in right leg: Secondary | ICD-10-CM | POA: Diagnosis not present

## 2023-02-17 DIAGNOSIS — M79605 Pain in left leg: Secondary | ICD-10-CM | POA: Diagnosis not present

## 2023-02-17 DIAGNOSIS — M6281 Muscle weakness (generalized): Secondary | ICD-10-CM | POA: Diagnosis not present

## 2023-02-17 DIAGNOSIS — M5459 Other low back pain: Secondary | ICD-10-CM | POA: Diagnosis not present

## 2023-02-21 DIAGNOSIS — R262 Difficulty in walking, not elsewhere classified: Secondary | ICD-10-CM | POA: Diagnosis not present

## 2023-02-21 DIAGNOSIS — M5459 Other low back pain: Secondary | ICD-10-CM | POA: Diagnosis not present

## 2023-02-21 DIAGNOSIS — M79605 Pain in left leg: Secondary | ICD-10-CM | POA: Diagnosis not present

## 2023-02-21 DIAGNOSIS — M6281 Muscle weakness (generalized): Secondary | ICD-10-CM | POA: Diagnosis not present

## 2023-02-21 DIAGNOSIS — M79604 Pain in right leg: Secondary | ICD-10-CM | POA: Diagnosis not present

## 2023-02-23 DIAGNOSIS — R262 Difficulty in walking, not elsewhere classified: Secondary | ICD-10-CM | POA: Diagnosis not present

## 2023-02-23 DIAGNOSIS — M5459 Other low back pain: Secondary | ICD-10-CM | POA: Diagnosis not present

## 2023-02-23 DIAGNOSIS — M79604 Pain in right leg: Secondary | ICD-10-CM | POA: Diagnosis not present

## 2023-02-23 DIAGNOSIS — M6281 Muscle weakness (generalized): Secondary | ICD-10-CM | POA: Diagnosis not present

## 2023-02-23 DIAGNOSIS — M79605 Pain in left leg: Secondary | ICD-10-CM | POA: Diagnosis not present

## 2023-02-28 DIAGNOSIS — M6281 Muscle weakness (generalized): Secondary | ICD-10-CM | POA: Diagnosis not present

## 2023-02-28 DIAGNOSIS — M79604 Pain in right leg: Secondary | ICD-10-CM | POA: Diagnosis not present

## 2023-02-28 DIAGNOSIS — M79605 Pain in left leg: Secondary | ICD-10-CM | POA: Diagnosis not present

## 2023-02-28 DIAGNOSIS — M5459 Other low back pain: Secondary | ICD-10-CM | POA: Diagnosis not present

## 2023-02-28 DIAGNOSIS — R262 Difficulty in walking, not elsewhere classified: Secondary | ICD-10-CM | POA: Diagnosis not present

## 2023-03-02 DIAGNOSIS — R262 Difficulty in walking, not elsewhere classified: Secondary | ICD-10-CM | POA: Diagnosis not present

## 2023-03-02 DIAGNOSIS — M79605 Pain in left leg: Secondary | ICD-10-CM | POA: Diagnosis not present

## 2023-03-02 DIAGNOSIS — M6281 Muscle weakness (generalized): Secondary | ICD-10-CM | POA: Diagnosis not present

## 2023-03-02 DIAGNOSIS — M5459 Other low back pain: Secondary | ICD-10-CM | POA: Diagnosis not present

## 2023-03-02 DIAGNOSIS — M79604 Pain in right leg: Secondary | ICD-10-CM | POA: Diagnosis not present

## 2023-03-07 DIAGNOSIS — M5459 Other low back pain: Secondary | ICD-10-CM | POA: Diagnosis not present

## 2023-03-07 DIAGNOSIS — M6281 Muscle weakness (generalized): Secondary | ICD-10-CM | POA: Diagnosis not present

## 2023-03-07 DIAGNOSIS — R262 Difficulty in walking, not elsewhere classified: Secondary | ICD-10-CM | POA: Diagnosis not present

## 2023-03-07 DIAGNOSIS — M79605 Pain in left leg: Secondary | ICD-10-CM | POA: Diagnosis not present

## 2023-03-07 DIAGNOSIS — M79604 Pain in right leg: Secondary | ICD-10-CM | POA: Diagnosis not present

## 2023-03-09 DIAGNOSIS — M79605 Pain in left leg: Secondary | ICD-10-CM | POA: Diagnosis not present

## 2023-03-09 DIAGNOSIS — R262 Difficulty in walking, not elsewhere classified: Secondary | ICD-10-CM | POA: Diagnosis not present

## 2023-03-09 DIAGNOSIS — M5459 Other low back pain: Secondary | ICD-10-CM | POA: Diagnosis not present

## 2023-03-09 DIAGNOSIS — M79604 Pain in right leg: Secondary | ICD-10-CM | POA: Diagnosis not present

## 2023-03-09 DIAGNOSIS — M6281 Muscle weakness (generalized): Secondary | ICD-10-CM | POA: Diagnosis not present

## 2023-03-20 ENCOUNTER — Other Ambulatory Visit: Payer: Self-pay | Admitting: Family Medicine

## 2023-03-20 ENCOUNTER — Ambulatory Visit
Admission: RE | Admit: 2023-03-20 | Discharge: 2023-03-20 | Disposition: A | Discharge status: Home / Self Care | Payer: Medicare HMO | Source: Ambulatory Visit | Attending: Family Medicine | Admitting: Family Medicine

## 2023-03-20 DIAGNOSIS — M5136 Other intervertebral disc degeneration, lumbar region with discogenic back pain only: Secondary | ICD-10-CM

## 2023-03-20 DIAGNOSIS — M48061 Spinal stenosis, lumbar region without neurogenic claudication: Secondary | ICD-10-CM | POA: Diagnosis not present

## 2023-03-24 DIAGNOSIS — M47816 Spondylosis without myelopathy or radiculopathy, lumbar region: Secondary | ICD-10-CM | POA: Diagnosis not present

## 2023-03-24 DIAGNOSIS — M48062 Spinal stenosis, lumbar region with neurogenic claudication: Secondary | ICD-10-CM | POA: Diagnosis not present

## 2023-03-24 DIAGNOSIS — M4316 Spondylolisthesis, lumbar region: Secondary | ICD-10-CM | POA: Diagnosis not present

## 2023-03-28 ENCOUNTER — Other Ambulatory Visit: Payer: Self-pay | Admitting: Neurosurgery

## 2023-03-29 ENCOUNTER — Other Ambulatory Visit: Payer: Self-pay | Admitting: Neurosurgery

## 2023-04-10 DIAGNOSIS — E039 Hypothyroidism, unspecified: Secondary | ICD-10-CM | POA: Diagnosis not present

## 2023-04-17 NOTE — Progress Notes (Signed)
Surgical Instructions   Your procedure is scheduled on Monday April 24, 2023. Report to [facility] 2 Main Entrance "A" at 9:50 A.M., then check in with the Admitting office. Any questions or running late day of surgery: call [phone]  Questions prior to your surgery date: call [phone], Monday-Friday, 8am-4pm. If you experience any cold or flu symptoms such as cough, fever, chills, shortness of breath, etc. between now and your scheduled surgery, please notify us at the above number.     Remember:  Do not eat or drink after midnight the night before your surgery  Take these medicines the morning of surgery with A SIP OF WATER  atorvastatin (LIPITOR)  gabapentin (NEURONTIN)  levothyroxine (SYNTHROID)   May take these medicines IF NEEDED: loratadine (CLARITIN)   One week prior to surgery, STOP taking any Aspirin (unless otherwise instructed by your surgeon) Aleve, Naproxen, Ibuprofen, Motrin, Advil, Goody's, BC's, all herbal medications, fish oil, and non-prescription vitamins.                     Do NOT Smoke (Tobacco/Vaping) for 24 hours prior to your procedure.  If you use a CPAP at night, you may bring your mask/headgear for your overnight stay.   You will be asked to remove any contacts, glasses, piercing's, hearing aid's, dentures/partials prior to surgery. Please bring cases for these items if needed.    Patients discharged the day of surgery will not be allowed to drive home, and someone needs to stay with them for 24 hours.  SURGICAL WAITING ROOM VISITATION Patients may have no more than 2 support people in the waiting area - these visitors may rotate.   Pre-op nurse will coordinate an appropriate time for 1 ADULT support person, who may not rotate, to accompany patient in pre-op.  Children under the age of 83 must have an adult with them who is not the patient and must remain in the main waiting area with an adult.  If the patient needs to stay at the hospital  during part of their recovery, the visitor guidelines for inpatient rooms apply.  Please refer to the [facility] website for the visitor guidelines for any additional information.   If you received a COVID test during your pre-op visit  it is requested that you wear a mask when out in public, stay away from anyone that may not be feeling well and notify your surgeon if you develop symptoms. If you have been in contact with anyone that has tested positive in the last 10 days please notify you surgeon.      Pre-operative 5 CHG Bathing Instructions   You can play a key role in reducing the risk of infection after surgery. Your skin needs to be as free of germs as possible. You can reduce the number of germs on your skin by washing with CHG (chlorhexidine gluconate) soap before surgery. CHG is an antiseptic soap that kills germs and continues to kill germs even after washing.   DO NOT use if you have an allergy to chlorhexidine/CHG or antibacterial soaps. If your skin becomes reddened or irritated, stop using the CHG and notify one of our RNs at [phone].   Please shower with the CHG soap starting 4 days before surgery using the following schedule:     Please keep in mind the following:  DO NOT shave, including legs and underarms, starting the day of your first shower.   You may shave your face at any point before/day  of surgery.  Place clean sheets on your bed the day you start using CHG soap. Use a clean washcloth (not used since being washed) for each shower. DO NOT sleep with pets once you start using the CHG.   CHG Shower Instructions:  Wash your face and private area with normal soap. If you choose to wash your hair, wash first with your normal shampoo.  After you use shampoo/soap, rinse your hair and body thoroughly to remove shampoo/soap residue.  Turn the water OFF and apply about 3 tablespoons (45 ml) of CHG soap to a CLEAN washcloth.  Apply CHG soap ONLY FROM YOUR NECK DOWN  TO YOUR TOES (washing for 3-5 minutes)  DO NOT use CHG soap on face, private areas, open wounds, or sores.  Pay special attention to the area where your surgery is being performed.  If you are having back surgery, having someone wash your back for you may be helpful. Wait 2 minutes after CHG soap is applied, then you may rinse off the CHG soap.  Pat dry with a clean towel  Put on clean clothes/pajamas   If you choose to wear lotion, please use ONLY the CHG-compatible lotions on the back of this paper.   Additional instructions for the day of surgery: DO NOT APPLY any lotions, deodorants or perfumes.   Do not bring valuables to the hospital. [facility] is not responsible for any belongings/valuables. Do not wear nail polish, gel polish, artificial nails, or any other type of covering on natural nails (fingers and toes) Do not wear jewelry or makeup Put on clean/comfortable clothes.  Please brush your teeth.  Ask your nurse before applying any prescription medications to the skin.     CHG Compatible Lotions   Aveeno Moisturizing lotion  Cetaphil Moisturizing Cream  Cetaphil Moisturizing Lotion  Clairol Herbal Essence Moisturizing Lotion, Dry Skin  Clairol Herbal Essence Moisturizing Lotion, Extra Dry Skin  Clairol Herbal Essence Moisturizing Lotion, Normal Skin  Curel Age Defying Therapeutic Moisturizing Lotion with Alpha Hydroxy  Curel Extreme Care Body Lotion  Curel Soothing Hands Moisturizing Hand Lotion  Curel Therapeutic Moisturizing Cream, Fragrance-Free  Curel Therapeutic Moisturizing Lotion, Fragrance-Free  Curel Therapeutic Moisturizing Lotion, Original Formula  Eucerin Daily Replenishing Lotion  Eucerin Dry Skin Therapy Plus Alpha Hydroxy Crme  Eucerin Dry Skin Therapy Plus Alpha Hydroxy Lotion  Eucerin Original Crme  Eucerin Original Lotion  Eucerin Plus Crme Eucerin Plus Lotion  Eucerin TriLipid Replenishing Lotion  Keri Anti-Bacterial Hand Lotion  Keri Deep  Conditioning Original Lotion Dry Skin Formula Softly Scented  Keri Deep Conditioning Original Lotion, Fragrance Free Sensitive Skin Formula  Keri Lotion Fast Absorbing Fragrance Free Sensitive Skin Formula  Keri Lotion Fast Absorbing Softly Scented Dry Skin Formula  Keri Original Lotion  Keri Skin Renewal Lotion Keri Silky Smooth Lotion  Keri Silky Smooth Sensitive Skin Lotion  Nivea Body Creamy Conditioning Oil  Nivea Body Extra Enriched Lotion  Nivea Body Original Lotion  Nivea Body Sheer Moisturizing Lotion Nivea Crme  Nivea Skin Firming Lotion  NutraDerm 30 Skin Lotion  NutraDerm Skin Lotion  NutraDerm Therapeutic Skin Cream  NutraDerm Therapeutic Skin Lotion  ProShield Protective Hand Cream  Provon moisturizing lotion  Please read over the following fact sheets that you were given.

## 2023-04-18 ENCOUNTER — Other Ambulatory Visit: Payer: Self-pay

## 2023-04-18 ENCOUNTER — Encounter (HOSPITAL_COMMUNITY): Payer: Self-pay

## 2023-04-18 ENCOUNTER — Encounter (HOSPITAL_COMMUNITY)
Admission: RE | Admit: 2023-04-18 | Discharge: 2023-04-18 | Disposition: A | Discharge status: Home / Self Care | Payer: Medicare HMO | Source: Ambulatory Visit | Attending: Neurosurgery | Admitting: Neurosurgery

## 2023-04-18 VITALS — BP 135/68 | HR 100 | Temp 98.2°F | Resp 18 | Ht 65.0 in | Wt 124.1 lb

## 2023-04-18 DIAGNOSIS — I129 Hypertensive chronic kidney disease with stage 1 through stage 4 chronic kidney disease, or unspecified chronic kidney disease: Secondary | ICD-10-CM | POA: Diagnosis not present

## 2023-04-18 DIAGNOSIS — Z01818 Encounter for other preprocedural examination: Secondary | ICD-10-CM

## 2023-04-18 DIAGNOSIS — Z01812 Encounter for preprocedural laboratory examination: Secondary | ICD-10-CM | POA: Insufficient documentation

## 2023-04-18 DIAGNOSIS — E1122 Type 2 diabetes mellitus with diabetic chronic kidney disease: Secondary | ICD-10-CM | POA: Insufficient documentation

## 2023-04-18 DIAGNOSIS — M4316 Spondylolisthesis, lumbar region: Secondary | ICD-10-CM | POA: Insufficient documentation

## 2023-04-18 DIAGNOSIS — I7300 Raynaud's syndrome without gangrene: Secondary | ICD-10-CM | POA: Insufficient documentation

## 2023-04-18 DIAGNOSIS — D631 Anemia in chronic kidney disease: Secondary | ICD-10-CM | POA: Insufficient documentation

## 2023-04-18 DIAGNOSIS — I5181 Takotsubo syndrome: Secondary | ICD-10-CM | POA: Insufficient documentation

## 2023-04-18 DIAGNOSIS — N183 Chronic kidney disease, stage 3 unspecified: Secondary | ICD-10-CM | POA: Diagnosis not present

## 2023-04-18 HISTORY — DX: Cardiac arrest, cause unspecified: I46.9

## 2023-04-18 LAB — CBC
HCT: 31.3 % — ABNORMAL LOW (ref 36.0–46.0)
Hemoglobin: 9.9 g/dL — ABNORMAL LOW (ref 12.0–15.0)
MCH: 28.5 pg (ref 26.0–34.0)
MCHC: 31.6 g/dL (ref 30.0–36.0)
MCV: 90.2 fL (ref 80.0–100.0)
Platelets: 505 10*3/uL — ABNORMAL HIGH (ref 150–400)
RBC: 3.47 MIL/uL — ABNORMAL LOW (ref 3.87–5.11)
RDW: 13.6 % (ref 11.5–15.5)
WBC: 8.9 10*3/uL (ref 4.0–10.5)
nRBC: 0 % (ref 0.0–0.2)

## 2023-04-18 LAB — GLUCOSE, CAPILLARY: Glucose-Capillary: 92 mg/dL (ref 70–99)

## 2023-04-18 LAB — BASIC METABOLIC PANEL
Anion gap: 8 (ref 5–15)
BUN: 34 mg/dL — ABNORMAL HIGH (ref 8–23)
CO2: 22 mmol/L (ref 22–32)
Calcium: 8.6 mg/dL — ABNORMAL LOW (ref 8.9–10.3)
Chloride: 106 mmol/L (ref 98–111)
Creatinine, Ser: 1.41 mg/dL — ABNORMAL HIGH (ref 0.44–1.00)
GFR, Estimated: 38 mL/min — ABNORMAL LOW (ref >60)
Glucose, Bld: 114 mg/dL — ABNORMAL HIGH (ref 70–99)
Potassium: 4.2 mmol/L (ref 3.5–5.1)
Sodium: 136 mmol/L (ref 135–145)

## 2023-04-18 LAB — HEMOGLOBIN A1C
Hgb A1c MFr Bld: 6.1 % — ABNORMAL HIGH (ref 4.8–5.6)
Mean Plasma Glucose: 128.37 mg/dL

## 2023-04-18 LAB — SURGICAL PCR SCREEN
MRSA, PCR: NEGATIVE
Staphylococcus aureus: POSITIVE — AB

## 2023-04-18 NOTE — Progress Notes (Signed)
PCP - Dr. Laurann Montana Cardiologist - Dr. [place] Pulmonary HTN Cardiologist: Dr. Arvilla Meres  PPM/ICD - denies Device Orders - na Rep Notified - na  Chest x-ray - na EKG - 07/14/2022  Stress Test -  ECHO - 06/28/2022 Cardiac Cath - 03/12/2018  Sleep Study - denies CPAP - na  Type II diabetic. Diet controlled A1C drawn 04/18/2023 Fasting Blood Sugar - does not check blood sugars Checks Blood Sugar does not check blood sugars  Last dose of GLP1 agonist-  none GLP1 instructions: none  Blood Thinner Instructions: denies Aspirin Instructions:denies  ERAS Protcol - NPO  COVID TEST- na  Anesthesia review: Yes. Cardiac arrest, DM, HTN, MI, high cholesterol, Takotsubo cardiomyopathy   Patient denies shortness of breath, fever, cough and chest pain at PAT appointment   All instructions explained to the patient, with a verbal understanding of the material. Patient agrees to go over the instructions while at home for a better understanding. Patient also instructed to self quarantine after being tested for COVID-19. The opportunity to ask questions was provided.

## 2023-04-19 ENCOUNTER — Encounter (HOSPITAL_COMMUNITY): Payer: Self-pay

## 2023-04-19 NOTE — Progress Notes (Signed)
Anesthesia Chart Review:  Case: [id number] Date/Time: 04/24/23 1135   Procedure: PLIF L23;L34,L45 POSTERIOR LATERAL ARTHRODESIS; POSTERIOR INSTRUMENTATION L2-5   Anesthesia type: General   Pre-op diagnosis: SPONDYLOLISTHESIS, LUMBAR REGION   [facility] ROOM 21 / [facility] OR   Surgeons: Tressie Stalker, MD       DISCUSSION: Patient is a 77 year old female scheduled for the above procedure.  History includes never smoker, HTN, DM2, MNG goiter/papillary thyroid cancer (s/p thyroidectomy 12/28/10, radiation), Takotsubo cardiomyopathy (post-thyroidectomy pulmonary edema/non-Q wave MI, normal coronaries, EF 40% c/w Takotsubo CM 12/31/10), Scleroderma, Raynaud's disease, polymyalgia rheumatica.  Last cardiology evaluation was on 07/14/22 by Dr. Gala Romney for pulmonary hypertension screening given history of scleroderma (diagnosed at [facility] years ago).  RHC in 03/2018 which demonstrated normal findings overall PA = 23/7 (15). 10//2019 PFTs with moderately decreased DLCO, most likely consistent with at least mild pulmonary vascular disease, otherwise normal. 06/2017 HRCT showed no ILD, but had severe tracheobronchomalacia, moderate air trapping consistent with small airways disease, small pulmonary nodules, aortic atherosclerosis and 3 vessel coronary calcifications. 5 mm RUL nodules stable on 10/23/18 with no imaging follow-up recommended if considered low risk. At March visit, she was felt overall stable with no indication of developing PAH or ILD. On statin therapy for atherosclerosis with LDL goal < 70. Consider adding Jardiance. Last echo on 06/30/22 showed LVEF 55-60%, no regional wall motion abnormalities, mild concentric LVH with moderate focal hypertrophy of the basal septum, grade 1 diastolic dysfunction, normal RV systolic function, trivial MR.Follow-up in 18 months recommended.  Preoperative labs showed H/H 9.9/31.3, MCH 90.2. BUN 34, Creatinine 1.41, eGFR 38. [facility] records requested,  but limited records indicate she was recently referred earlier this month to get established with [facility] for CKD stage 3, anemia of chronic disease, and hyperphosphatemia. Visit is not yet scheduled. Reportedly comparison HGB 10.8 and Creatinine 1.26 on 02/16/23. T&S done with PAT labs. I also called HGB and Creatinine to [facility] at Dr. Lovell Sheehan' office.   ADDENDUM 04/20/23 1:41 PM: I received last labs and office note from 02/16/23 by Dr. Cliffton Asters. She added an iron panel given anemia, which was normal. She thought patient's anemia was most likely due to CKD. She advised referral to nephrology indicating, referral was not urgent and could take months to be seen but she wanted her to get established in the event patient had worsening renal function and/or anemia. Copy of labs routed to Dr. Cliffton Asters for future follow-up purposes. Results already called to surgeon's office.   Defer additional preoperative labs, if any, to surgeon and/or anesthesiologist. Anesthesia team to evaluate on the day of surgery.     VS: BP 135/68   Pulse 100   Temp 36.8 C   Resp 18   Ht 5\' 5"  (1.651 m)   Wt 56.3 kg   SpO2 100%   BMI 20.65 kg/m    PROVIDERS: Laurann Montana, MD is PCP [place], Peter, MD is cardiologist, last evaluation 2013 for follow-up Takotsubo CM. Arvilla Meres, MD is HF/Pulm HTN cardiologist Alben Deeds, MD is rheumatologist Dorisann Frames, MD is endocrinologist   LABS: See DISCUSSION. (all labs ordered are listed, but only abnormal results are displayed)  Labs Reviewed  SURGICAL PCR SCREEN - Abnormal; Notable for the following components:      Result Value   Staphylococcus aureus POSITIVE (*)    All other components within normal limits  HEMOGLOBIN A1C - Abnormal; Notable for the following components:   Hgb A1c MFr  Bld 6.1 (*)    All other components within normal limits  BASIC METABOLIC PANEL - Abnormal; Notable for the following components:   Glucose, Bld 114 (*)     BUN 34 (*)    Creatinine, Ser 1.41 (*)    Calcium 8.6 (*)    GFR, Estimated 38 (*)    All other components within normal limits  CBC - Abnormal; Notable for the following components:   RBC 3.47 (*)    Hemoglobin 9.9 (*)    HCT 31.3 (*)    Platelets 505 (*)    All other components within normal limits  GLUCOSE, CAPILLARY  TYPE AND SCREEN    PFTs 06/30/22: FVC 3.42 (119%). FEV1 2.81 (131%). FEV1/FVC .82 (110%). FEF 25-75% 3.04 (188%). DLCO unc 12.44 (63%). Comments: Good patient effort.  The results of this test does not meet ATS standards; DLCO received error code IVC < 90%  Hgb default value of 13.4 used.  The FVC, FEV1, FEV1/FVC ratio and FEV1 FEF 25-75% are within normal limits.  The airway resistance is normal.  Lung volumes are within normal limits.  The reduced diffusing capacity indicates a moderate loss of functional alveolar capillary surface.  However, the diffusing Capacity was not corrected for the patient's hemoglobin. Conclusions: The diffusion defect is consistent with a pulmonary vascular process.  Anemia cannot be excluded as a potential cause of the diffusion defect without correcting the observed diffusion capacity for hemoglobin.  In view of the severity of the diffusion defect, studies with exercise would be helpful to evaluate the presence of hypoxemia. Pulmonary function diagnosis: Moderate diffusion defect - Pulmonary Vascular   IMAGES: MRI L-spine 03/20/23: IMPRESSION: 1. Advanced lumbar disc and facet degeneration with severe spinal stenosis at L2-3 and L4-5. 2. Severe neural foraminal stenosis on the left at L2-3 and L3-4 and on the right at L4-5 and L5-S1. 3. Prominent endplate edema at W0-9 which is favored to be degenerative and may be a source of back pain.    EKG: 07/14/22: NSR   CV: Echo 06/30/22: IMPRESSIONS   1. Left ventricular ejection fraction, by estimation, is 55 to 60%. The  left ventricle has normal function. The left ventricle has no  regional  wall motion abnormalities. There is mild concentric left ventricular  hypertrophy with moderaate focal  hypertrophy of the basilar septum. Left ventricular diastolic parameters  are consistent with Grade I diastolic dysfunction (impaired relaxation).   2. Right ventricular systolic function is normal. The right ventricular  size is normal.   3. The mitral valve is normal in structure. Trivial mitral valve  regurgitation. No evidence of mitral stenosis.   4. The aortic valve is tricuspid. There is mild calcification of the  aortic valve. Aortic valve regurgitation is not visualized. Aortic valve  sclerosis/calcification is present, without any evidence of aortic  stenosis. Aortic valve area, by VTI measures   2.23 cm. Aortic valve mean gradient measures 2.0 mmHg. Aortic valve Vmax  measures 1.06 m/s.   5. The inferior vena cava is normal in size with greater than 50%  respiratory variability, suggesting right atrial pressure of 3 mmHg.    RHC 03/12/18: Findings: RA = 4 RV = 21/4 PA = 23/7 (15) PCW = 6 Fick cardiac output/index = 4.5/2.8 PVR = 2.0 WU Ao sat = 98% PA sat = 67%,68%   Assessment: Normal hemodynamics.    Plan/Discussion: No evidence of scleroderma-related PAH. Continue screening program.  Suspect DLCO decreased due to parenchymal lung disease.  LHC 12/31/10: FINAL INTERPRETATION: 1. Normal coronary anatomy. 2. Moderate left ventricular dysfunction.  Pattern of LV dysfunction     would be consistent with possible Takotsubo-related cardiomyopathy.   PLAN:  We will treat medically.  Past Medical History:  Diagnosis Date   Cancer (HCC)    thyroid   Diabetes mellitus    Hyperlipidemia    Hypertension    NSTEMI (non-ST elevated myocardial infarction) (HCC) 12/2010   pulmonary edema & NSTEMI post-thyroidectomy; 12/31/10 LHV: Normal coronaries, moderate LV dysfunction, EF 40%   Osteopenia    Scleroderma (HCC)    Scoliosis    Takotsubo  cardiomyopathy 12/2010   post-thyroidectomy   Thyroid goiter    s/p thyroidectomy    Past Surgical History:  Procedure Laterality Date   CARDIAC CATHETERIZATION  12/31/10   Left ventricular angiography performed in the RAO view demonstrates  moderate-to-severe hypokinesis involving the mid-to-distal anterior  wall, distal inferior wall, and apex.  Overall, systolic function is  moderately reduced with ejection fraction estimated at 40%.    radioactiveiodine     thyrotoxicosis 1999   RIGHT HEART CATH N/A 03/12/2018   Procedure: RIGHT HEART CATH;  Surgeon: Dolores Patty, MD;  Location: [facility] INVASIVE CV LAB;  Service: Cardiovascular;  Laterality: N/A;   THYROIDECTOMY     12/28/2010   TRANSTHORACIC ECHOCARDIOGRAM  12/30/2010   Left ventricle: LVEF is approximately 25 to 30% with hypokinesis  of the distal inferoseptal, mid/distal lateral, mid/distal   anterior, distal inferior, mid/distal mid/distal posterior and   apical walls   TRANSTHORACIC ECHOCARDIOGRAM  10/28/2010   Left ventricle: The cavity size was normal. There was mild concentric hypertrophy. Systolic function was lower limits of normal. The estimated ejection fraction was in the range of 50% to 55%.     MEDICATIONS:  atorvastatin (LIPITOR) 40 MG tablet   B Complex-C (SUPER B COMPLEX PO)   Calcium-Phosphorus-Vitamin D (CALCIUM GUMMIES PO)   Cholecalciferol (VITAMIN D-3) 25 MCG (1000 UT) CAPS   enalapril (VASOTEC) 20 MG tablet   gabapentin (NEURONTIN) 100 MG capsule   hydrochlorothiazide (MICROZIDE) 12.5 MG capsule   levothyroxine (SYNTHROID) 100 MCG tablet   loratadine (CLARITIN) 10 MG tablet   Magnesium 250 MG TABS   Multiple Vitamins-Minerals (MULTIVITAMIN GUMMIES ADULT PO)   Omega-3 Fatty Acids (FISH OIL PO)   No current facility-administered medications for this encounter.    Shonna Chock, PA-C Surgical Short Stay/Anesthesiology [facility] Phone [phone] [facility] Phone [phone] 04/19/2023 3:30  PM

## 2023-04-19 NOTE — Anesthesia Preprocedure Evaluation (Addendum)
Anesthesia Evaluation  Patient identified by MRN, date of birth, ID band Patient awake    Reviewed: Allergy & Precautions, H&P , NPO status , Patient's Chart, lab work & pertinent test results  Airway Mallampati: II   Neck ROM: full    Dental   Pulmonary    breath sounds clear to auscultation       Cardiovascular hypertension,  Rhythm:regular Rate:Normal  H/o Takotsubo cardiomyopathy 2012.  TTE (06/30/22): EF 55-60%   Neuro/Psych    GI/Hepatic   Endo/Other  diabetes    Renal/GU      Musculoskeletal   Abdominal   Peds  Hematology   Anesthesia Other Findings   Reproductive/Obstetrics                             Anesthesia Physical Anesthesia Plan  ASA: 3  Anesthesia Plan: General   Post-op Pain Management:    Induction: Intravenous  PONV Risk Score and Plan: 3 and Ondansetron, Dexamethasone and Treatment may vary due to age or medical condition  Airway Management Planned: Oral ETT  Additional Equipment:   Intra-op Plan:   Post-operative Plan: Extubation in OR  Informed Consent: I have reviewed the patients History and Physical, chart, labs and discussed the procedure including the risks, benefits and alternatives for the proposed anesthesia with the patient or authorized representative who has indicated his/her understanding and acceptance.     Dental advisory given  Plan Discussed with: CRNA, Anesthesiologist and Surgeon  Anesthesia Plan Comments: (PAT note written 04/19/2023 by Shonna Chock, PA-C.  )       Anesthesia Quick Evaluation

## 2023-04-24 ENCOUNTER — Ambulatory Visit (HOSPITAL_COMMUNITY): Payer: Medicare HMO | Admitting: Vascular Surgery

## 2023-04-24 ENCOUNTER — Ambulatory Visit (HOSPITAL_COMMUNITY): Payer: Medicare HMO

## 2023-04-24 ENCOUNTER — Ambulatory Visit (HOSPITAL_COMMUNITY): Payer: Medicare HMO | Admitting: Anesthesiology

## 2023-04-24 ENCOUNTER — Ambulatory Visit (HOSPITAL_COMMUNITY)
Admission: RE | Admit: 2023-04-24 | Discharge: 2023-04-26 | Disposition: A | Discharge status: Home / Self Care | Payer: Medicare HMO | Attending: Neurosurgery | Admitting: Neurosurgery

## 2023-04-24 ENCOUNTER — Encounter (HOSPITAL_COMMUNITY): Payer: Self-pay | Admitting: Neurosurgery

## 2023-04-24 ENCOUNTER — Ambulatory Visit (HOSPITAL_COMMUNITY)
Admission: RE | Disposition: A | Discharge status: Home / Self Care | Payer: Self-pay | Source: Home / Self Care | Attending: Neurosurgery

## 2023-04-24 DIAGNOSIS — M4316 Spondylolisthesis, lumbar region: Secondary | ICD-10-CM | POA: Diagnosis not present

## 2023-04-24 DIAGNOSIS — M48062 Spinal stenosis, lumbar region with neurogenic claudication: Secondary | ICD-10-CM | POA: Diagnosis not present

## 2023-04-24 DIAGNOSIS — M5116 Intervertebral disc disorders with radiculopathy, lumbar region: Secondary | ICD-10-CM | POA: Diagnosis not present

## 2023-04-24 DIAGNOSIS — M4156 Other secondary scoliosis, lumbar region: Secondary | ICD-10-CM | POA: Insufficient documentation

## 2023-04-24 DIAGNOSIS — I10 Essential (primary) hypertension: Secondary | ICD-10-CM | POA: Diagnosis not present

## 2023-04-24 DIAGNOSIS — R7302 Impaired glucose tolerance (oral): Secondary | ICD-10-CM

## 2023-04-24 DIAGNOSIS — C73 Malignant neoplasm of thyroid gland: Secondary | ICD-10-CM

## 2023-04-24 DIAGNOSIS — D62 Acute posthemorrhagic anemia: Secondary | ICD-10-CM | POA: Diagnosis not present

## 2023-04-24 DIAGNOSIS — Z01818 Encounter for other preprocedural examination: Secondary | ICD-10-CM

## 2023-04-24 DIAGNOSIS — I5181 Takotsubo syndrome: Secondary | ICD-10-CM

## 2023-04-24 DIAGNOSIS — Z9689 Presence of other specified functional implants: Secondary | ICD-10-CM | POA: Diagnosis not present

## 2023-04-24 DIAGNOSIS — E119 Type 2 diabetes mellitus without complications: Secondary | ICD-10-CM | POA: Diagnosis not present

## 2023-04-24 DIAGNOSIS — M419 Scoliosis, unspecified: Secondary | ICD-10-CM | POA: Diagnosis not present

## 2023-04-24 LAB — GLUCOSE, CAPILLARY
Glucose-Capillary: 100 mg/dL — ABNORMAL HIGH (ref 70–99)
Glucose-Capillary: 89 mg/dL (ref 70–99)
Glucose-Capillary: 137 mg/dL — ABNORMAL HIGH (ref 70–99)
Glucose-Capillary: 194 mg/dL — ABNORMAL HIGH (ref 70–99)

## 2023-04-24 LAB — ABO/RH: ABO/RH(D): O POS

## 2023-04-24 SURGERY — POSTERIOR LUMBAR FUSION 1 LEVEL
Anesthesia: General | Site: Spine Lumbar

## 2023-04-24 MED ORDER — PROPOFOL 10 MG/ML IV BOLUS
INTRAVENOUS | Status: AC
  Filled 2023-04-24: qty 20

## 2023-04-24 MED ORDER — VASHE WOUND IRRIGATION OPTIME
TOPICAL | Status: DC | PRN
  Administered 2023-04-24: 34 [oz_av] via TOPICAL

## 2023-04-24 MED ORDER — CEFAZOLIN SODIUM-DEXTROSE 2-4 GM/100ML-% IV SOLN
INTRAVENOUS | Status: AC
  Filled 2023-04-24: qty 100

## 2023-04-24 MED ORDER — SODIUM CHLORIDE 0.9% FLUSH: 3.0000 mL | INTRAVENOUS | Status: DC | PRN

## 2023-04-24 MED ORDER — THROMBIN 20000 UNITS EX SOLR
CUTANEOUS | Status: AC
  Filled 2023-04-24: qty 20000

## 2023-04-24 MED ORDER — DEXAMETHASONE SODIUM PHOSPHATE 10 MG/ML IJ SOLN
INTRAMUSCULAR | Status: DC | PRN
  Administered 2023-04-24: 10 mg via INTRAVENOUS

## 2023-04-24 MED ORDER — INSULIN ASPART 100 UNIT/ML IJ SOLN
0.0000 [IU] | INTRAMUSCULAR | Status: DC | PRN
Start: 2023-04-24 — End: 2023-04-24

## 2023-04-24 MED ORDER — CEFAZOLIN SODIUM-DEXTROSE 2-4 GM/100ML-% IV SOLN
2.0000 g | Freq: Two times a day (BID) | INTRAVENOUS | Status: AC
  Administered 2023-04-25 (×2): 2 g via INTRAVENOUS
  Filled 2023-04-24 (×2): qty 100

## 2023-04-24 MED ORDER — CHLORHEXIDINE GLUCONATE CLOTH 2 % EX PADS: 6.0000 | MEDICATED_PAD | Freq: Once | CUTANEOUS | Status: DC

## 2023-04-24 MED ORDER — CEFAZOLIN SODIUM-DEXTROSE 2-4 GM/100ML-% IV SOLN
2.0000 g | INTRAVENOUS | Status: AC
  Administered 2023-04-24 (×2): 2 g via INTRAVENOUS

## 2023-04-24 MED ORDER — HYDROCHLOROTHIAZIDE 12.5 MG PO TABS
12.5000 mg | ORAL_TABLET | Freq: Every day | ORAL | Status: DC
  Administered 2023-04-25: 12.5 mg via ORAL
  Filled 2023-04-24 (×2): qty 1

## 2023-04-24 MED ORDER — BUPIVACAINE-EPINEPHRINE (PF) 0.5% -1:200000 IJ SOLN
INTRAMUSCULAR | Status: DC | PRN
  Administered 2023-04-24: 10 mL

## 2023-04-24 MED ORDER — 0.9 % SODIUM CHLORIDE (POUR BTL) OPTIME
TOPICAL | Status: DC | PRN
  Administered 2023-04-24 (×2): 1000 mL

## 2023-04-24 MED ORDER — LORATADINE 10 MG PO TABS: 10.0000 mg | ORAL_TABLET | Freq: Every day | ORAL | Status: DC | PRN

## 2023-04-24 MED ORDER — CYCLOBENZAPRINE HCL 10 MG PO TABS: 10.0000 mg | ORAL_TABLET | Freq: Three times a day (TID) | ORAL | Status: DC | PRN

## 2023-04-24 MED ORDER — ONDANSETRON HCL 4 MG/2ML IJ SOLN: 4.0000 mg | Freq: Four times a day (QID) | INTRAMUSCULAR | Status: DC | PRN

## 2023-04-24 MED ORDER — ONDANSETRON HCL 4 MG/2ML IJ SOLN
INTRAMUSCULAR | Status: DC | PRN
  Administered 2023-04-24 (×2): 4 mg via INTRAVENOUS

## 2023-04-24 MED ORDER — HYDROMORPHONE HCL 1 MG/ML IJ SOLN
INTRAMUSCULAR | Status: AC
  Filled 2023-04-24: qty 0.5

## 2023-04-24 MED ORDER — BACITRACIN ZINC 500 UNIT/GM EX OINT
TOPICAL_OINTMENT | CUTANEOUS | Status: AC
  Filled 2023-04-24: qty 28.35

## 2023-04-24 MED ORDER — HYDROMORPHONE HCL 1 MG/ML IJ SOLN
INTRAMUSCULAR | Status: DC | PRN
  Administered 2023-04-24 (×2): .25 mg via INTRAVENOUS

## 2023-04-24 MED ORDER — CHLORHEXIDINE GLUCONATE 0.12 % MT SOLN
OROMUCOSAL | Status: AC
  Administered 2023-04-24: 15 mL via OROMUCOSAL
  Filled 2023-04-24: qty 15

## 2023-04-24 MED ORDER — FENTANYL CITRATE (PF) 100 MCG/2ML IJ SOLN
INTRAMUSCULAR | Status: AC
  Filled 2023-04-24: qty 2

## 2023-04-24 MED ORDER — THROMBIN 5000 UNITS EX SOLR
CUTANEOUS | Status: AC
  Filled 2023-04-24: qty 5000

## 2023-04-24 MED ORDER — ACETAMINOPHEN 325 MG PO TABS: 650.0000 mg | ORAL_TABLET | ORAL | Status: DC | PRN

## 2023-04-24 MED ORDER — SODIUM CHLORIDE 0.9% FLUSH
3.0000 mL | Freq: Two times a day (BID) | INTRAVENOUS | Status: DC
  Administered 2023-04-25: 3 mL via INTRAVENOUS

## 2023-04-24 MED ORDER — OXYCODONE HCL 5 MG PO TABS
10.0000 mg | ORAL_TABLET | ORAL | Status: DC | PRN
  Administered 2023-04-24: 10 mg via ORAL
  Filled 2023-04-24 (×2): qty 2

## 2023-04-24 MED ORDER — DEXMEDETOMIDINE HCL IN NACL 80 MCG/20ML IV SOLN
INTRAVENOUS | Status: AC
  Filled 2023-04-24: qty 20

## 2023-04-24 MED ORDER — OXYCODONE HCL 5 MG PO TABS
5.0000 mg | ORAL_TABLET | ORAL | Status: DC | PRN
Start: 2023-04-24 — End: 2023-04-26
  Administered 2023-04-25 – 2023-04-26 (×6): 5 mg via ORAL
  Filled 2023-04-24 (×6): qty 1

## 2023-04-24 MED ORDER — ROCURONIUM BROMIDE 10 MG/ML (PF) SYRINGE
PREFILLED_SYRINGE | INTRAVENOUS | Status: DC | PRN
  Administered 2023-04-24 (×2): 20 mg via INTRAVENOUS
  Administered 2023-04-24: 30 mg via INTRAVENOUS
  Administered 2023-04-24: 70 mg via INTRAVENOUS
  Administered 2023-04-24: 20 mg via INTRAVENOUS

## 2023-04-24 MED ORDER — PROPOFOL 10 MG/ML IV BOLUS
INTRAVENOUS | Status: DC | PRN
  Administered 2023-04-24: 80 mg via INTRAVENOUS

## 2023-04-24 MED ORDER — ATORVASTATIN CALCIUM 40 MG PO TABS
40.0000 mg | ORAL_TABLET | Freq: Every day | ORAL | Status: DC
  Administered 2023-04-25: 40 mg via ORAL
  Filled 2023-04-24: qty 1

## 2023-04-24 MED ORDER — FENTANYL CITRATE (PF) 100 MCG/2ML IJ SOLN
25.0000 ug | INTRAMUSCULAR | Status: DC | PRN
  Administered 2023-04-24 (×2): 50 ug via INTRAVENOUS

## 2023-04-24 MED ORDER — ORAL CARE MOUTH RINSE: 15.0000 mL | Freq: Once | OROMUCOSAL | Status: AC

## 2023-04-24 MED ORDER — ONDANSETRON HCL 4 MG PO TABS: 4.0000 mg | ORAL_TABLET | Freq: Four times a day (QID) | ORAL | Status: DC | PRN

## 2023-04-24 MED ORDER — FENTANYL CITRATE (PF) 250 MCG/5ML IJ SOLN
INTRAMUSCULAR | Status: DC | PRN
  Administered 2023-04-24 (×3): 50 ug via INTRAVENOUS
  Administered 2023-04-24: 100 ug via INTRAVENOUS

## 2023-04-24 MED ORDER — LIDOCAINE 2% (20 MG/ML) 5 ML SYRINGE
INTRAMUSCULAR | Status: DC | PRN
  Administered 2023-04-24: 60 mg via INTRAVENOUS

## 2023-04-24 MED ORDER — BACITRACIN ZINC 500 UNIT/GM EX OINT
TOPICAL_OINTMENT | CUTANEOUS | Status: DC | PRN
  Administered 2023-04-24: 1 via TOPICAL

## 2023-04-24 MED ORDER — ENALAPRIL MALEATE 10 MG PO TABS: 20.0000 mg | ORAL_TABLET | Freq: Every day | ORAL | Status: DC

## 2023-04-24 MED ORDER — THROMBIN 20000 UNITS EX SOLR
CUTANEOUS | Status: DC | PRN
  Administered 2023-04-24: 20 mL via TOPICAL

## 2023-04-24 MED ORDER — ADHERUS DURAL SEALANT: PACK | TOPICAL | Status: DC | PRN

## 2023-04-24 MED ORDER — SODIUM CHLORIDE 0.9 % IV SOLN: 250.0000 mL | INTRAVENOUS | Status: AC

## 2023-04-24 MED ORDER — PHENOL 1.4 % MT LIQD: 1.0000 | OROMUCOSAL | Status: DC | PRN

## 2023-04-24 MED ORDER — ALBUMIN HUMAN 5 % IV SOLN: INTRAVENOUS | Status: DC | PRN

## 2023-04-24 MED ORDER — FENTANYL CITRATE (PF) 250 MCG/5ML IJ SOLN
INTRAMUSCULAR | Status: AC
  Filled 2023-04-24: qty 5

## 2023-04-24 MED ORDER — DOCUSATE SODIUM 100 MG PO CAPS
100.0000 mg | ORAL_CAPSULE | Freq: Two times a day (BID) | ORAL | Status: DC
  Administered 2023-04-24 – 2023-04-25 (×3): 100 mg via ORAL
  Filled 2023-04-24 (×3): qty 1

## 2023-04-24 MED ORDER — MENTHOL 3 MG MT LOZG: 1.0000 | LOZENGE | OROMUCOSAL | Status: DC | PRN

## 2023-04-24 MED ORDER — OXYCODONE HCL 5 MG PO TABS: 5.0000 mg | ORAL_TABLET | Freq: Once | ORAL | Status: DC | PRN

## 2023-04-24 MED ORDER — ZOLPIDEM TARTRATE 5 MG PO TABS: 5.0000 mg | ORAL_TABLET | Freq: Every evening | ORAL | Status: DC | PRN

## 2023-04-24 MED ORDER — SUGAMMADEX SODIUM 200 MG/2ML IV SOLN
INTRAVENOUS | Status: DC | PRN
  Administered 2023-04-24: 200 mg via INTRAVENOUS

## 2023-04-24 MED ORDER — LEVOTHYROXINE SODIUM 100 MCG PO TABS
100.0000 ug | ORAL_TABLET | Freq: Every day | ORAL | Status: DC
Start: 2023-04-25 — End: 2023-04-26
  Administered 2023-04-25 – 2023-04-26 (×2): 100 ug via ORAL
  Filled 2023-04-24 (×2): qty 1

## 2023-04-24 MED ORDER — INSULIN ASPART 100 UNIT/ML IJ SOLN
0.0000 [IU] | Freq: Three times a day (TID) | INTRAMUSCULAR | Status: DC
  Administered 2023-04-25: 2 [IU] via SUBCUTANEOUS

## 2023-04-24 MED ORDER — SODIUM CHLORIDE 0.9 % IV SOLN: INTRAVENOUS | Status: DC | PRN

## 2023-04-24 MED ORDER — OXYCODONE HCL 5 MG/5ML PO SOLN
5.0000 mg | Freq: Once | ORAL | Status: DC | PRN
Start: 2023-04-24 — End: 2023-04-24

## 2023-04-24 MED ORDER — BISACODYL 10 MG RE SUPP: 10.0000 mg | Freq: Every day | RECTAL | Status: DC | PRN

## 2023-04-24 MED ORDER — ACETAMINOPHEN 650 MG RE SUPP: 650.0000 mg | RECTAL | Status: DC | PRN

## 2023-04-24 MED ORDER — ACETAMINOPHEN 10 MG/ML IV SOLN
INTRAVENOUS | Status: AC
  Filled 2023-04-24: qty 100

## 2023-04-24 MED ORDER — ACETAMINOPHEN 10 MG/ML IV SOLN
INTRAVENOUS | Status: DC | PRN
  Administered 2023-04-24: 1000 mg via INTRAVENOUS

## 2023-04-24 MED ORDER — INSULIN ASPART 100 UNIT/ML IJ SOLN: 0.0000 [IU] | Freq: Every day | INTRAMUSCULAR | Status: DC

## 2023-04-24 MED ORDER — THROMBIN 5000 UNITS EX SOLR
CUTANEOUS | Status: AC
Start: 2023-04-24 — End: ?
  Filled 2023-04-24: qty 5000

## 2023-04-24 MED ORDER — MORPHINE SULFATE (PF) 2 MG/ML IV SOLN: 2.0000 mg | INTRAVENOUS | Status: DC | PRN

## 2023-04-24 MED ORDER — CHLORHEXIDINE GLUCONATE 0.12 % MT SOLN: 15.0000 mL | Freq: Once | OROMUCOSAL | Status: AC

## 2023-04-24 MED ORDER — PHENYLEPHRINE HCL-NACL 20-0.9 MG/250ML-% IV SOLN
INTRAVENOUS | Status: DC | PRN
  Administered 2023-04-24: 25 ug/min via INTRAVENOUS

## 2023-04-24 MED ORDER — GABAPENTIN 100 MG PO CAPS
100.0000 mg | ORAL_CAPSULE | Freq: Three times a day (TID) | ORAL | Status: DC
  Administered 2023-04-24 – 2023-04-25 (×2): 100 mg via ORAL
  Filled 2023-04-24 (×2): qty 1

## 2023-04-24 MED ORDER — THROMBIN 5000 UNITS EX SOLR
OROMUCOSAL | Status: DC | PRN
  Administered 2023-04-24 (×3): 5 mL via TOPICAL

## 2023-04-24 MED ORDER — PHENYLEPHRINE 80 MCG/ML (10ML) SYRINGE FOR IV PUSH (FOR BLOOD PRESSURE SUPPORT)
PREFILLED_SYRINGE | INTRAVENOUS | Status: DC | PRN
  Administered 2023-04-24: 80 ug via INTRAVENOUS
  Administered 2023-04-24: 160 ug via INTRAVENOUS
  Administered 2023-04-24 (×2): 80 ug via INTRAVENOUS

## 2023-04-24 MED ORDER — ACETAMINOPHEN 500 MG PO TABS
1000.0000 mg | ORAL_TABLET | Freq: Four times a day (QID) | ORAL | Status: AC
  Administered 2023-04-24 – 2023-04-25 (×4): 1000 mg via ORAL
  Filled 2023-04-24 (×4): qty 2

## 2023-04-24 MED ORDER — BUPIVACAINE-EPINEPHRINE (PF) 0.5% -1:200000 IJ SOLN
INTRAMUSCULAR | Status: AC
  Filled 2023-04-24: qty 30

## 2023-04-24 MED ORDER — DEXMEDETOMIDINE HCL IN NACL 80 MCG/20ML IV SOLN
INTRAVENOUS | Status: DC | PRN
  Administered 2023-04-24: 8 ug via INTRAVENOUS
  Administered 2023-04-24: 4 ug via INTRAVENOUS

## 2023-04-24 SURGICAL SUPPLY — 67 items
BAG COUNTER SPONGE SURGICOUNT (BAG) ×1 IMPLANT
BASKET BONE COLLECTION (BASKET) ×1 IMPLANT
BENZOIN TINCTURE PRP APPL 2/3 (GAUZE/BANDAGES/DRESSINGS) ×1 IMPLANT
BLADE BONE MILL MEDIUM (MISCELLANEOUS) IMPLANT
BLADE CLIPPER SURG (BLADE) IMPLANT
BUR MATCHSTICK NEURO 3.0 LAGG (BURR) ×1 IMPLANT
BUR PRECISION FLUTE 6.0 (BURR) ×1 IMPLANT
CAGE SABLE 10X26 6-12 0D (Cage) IMPLANT
CAGE SABLE 10X26 9-16 0D (Cage) IMPLANT
CANISTER SUCT 3000ML PPV (MISCELLANEOUS) ×1 IMPLANT
CAP LOCK DLX THRD (Cap) IMPLANT
CLEANSER WND VASHE INSTL 34OZ (WOUND CARE) ×1 IMPLANT
CNTNR URN SCR LID CUP LEK RST (MISCELLANEOUS) ×1 IMPLANT
COVER BACK TABLE 60X90IN (DRAPES) ×1 IMPLANT
DRAPE C-ARM 42X72 X-RAY (DRAPES) ×2 IMPLANT
DRAPE HALF SHEET 40X57 (DRAPES) ×1 IMPLANT
DRAPE LAPAROTOMY 100X72X124 (DRAPES) ×1 IMPLANT
DRAPE SURG 17X23 STRL (DRAPES) ×4 IMPLANT
DRSG OPSITE POSTOP 4X10 (GAUZE/BANDAGES/DRESSINGS) IMPLANT
DRSG OPSITE POSTOP 4X6 (GAUZE/BANDAGES/DRESSINGS) ×1 IMPLANT
ELECT BLADE 4.0 EZ CLEAN MEGAD (MISCELLANEOUS) ×1 IMPLANT
ELECT REM PT RETURN 9FT ADLT (ELECTROSURGICAL) ×1 IMPLANT
ELECTRODE BLDE 4.0 EZ CLN MEGD (MISCELLANEOUS) ×1 IMPLANT
ELECTRODE REM PT RTRN 9FT ADLT (ELECTROSURGICAL) ×1 IMPLANT
EVACUATOR 1/8 PVC DRAIN (DRAIN) IMPLANT
GAUZE 4X4 16PLY ~~LOC~~+RFID DBL (SPONGE) ×1 IMPLANT
GLOVE BIO SURGEON STRL SZ 6 (GLOVE) ×1 IMPLANT
GLOVE BIO SURGEON STRL SZ7 (GLOVE) IMPLANT
GLOVE BIO SURGEON STRL SZ8 (GLOVE) ×2 IMPLANT
GLOVE BIO SURGEON STRL SZ8.5 (GLOVE) ×2 IMPLANT
GLOVE BIOGEL PI IND STRL 6.5 (GLOVE) ×1 IMPLANT
GLOVE BIOGEL PI IND STRL 7.5 (GLOVE) IMPLANT
GLOVE EXAM NITRILE XL STR (GLOVE) IMPLANT
GOWN STRL REUS W/ TWL LRG LVL3 (GOWN DISPOSABLE) ×1 IMPLANT
GOWN STRL REUS W/ TWL XL LVL3 (GOWN DISPOSABLE) ×2 IMPLANT
GOWN STRL REUS W/TWL 2XL LVL3 (GOWN DISPOSABLE) IMPLANT
GRAFT TRINITY ELITE LGE HUMAN (Tissue) IMPLANT
HEMOSTAT POWDER KIT SURGIFOAM (HEMOSTASIS) ×1 IMPLANT
KIT BASIN OR (CUSTOM PROCEDURE TRAY) ×1 IMPLANT
KIT GRAFTMAG DEL NEURO DISP (NEUROSURGERY SUPPLIES) IMPLANT
KIT POSITION SURG JACKSON T1 (MISCELLANEOUS) ×1 IMPLANT
KIT TURNOVER KIT B (KITS) ×1 IMPLANT
NDL HYPO 21X1.5 SAFETY (NEEDLE) IMPLANT
NDL HYPO 22X1.5 SAFETY MO (MISCELLANEOUS) ×1 IMPLANT
NEEDLE HYPO 21X1.5 SAFETY (NEEDLE) IMPLANT
NEEDLE HYPO 22X1.5 SAFETY MO (MISCELLANEOUS) ×1 IMPLANT
NS IRRIG 1000ML POUR BTL (IV SOLUTION) ×1 IMPLANT
PACK LAMINECTOMY NEURO (CUSTOM PROCEDURE TRAY) ×1 IMPLANT
PAD ARMBOARD 7.5X6 YLW CONV (MISCELLANEOUS) ×3 IMPLANT
PATTIES SURGICAL .5 X1 (DISPOSABLE) IMPLANT
PUTTY DBM 10CC CALC GRAN (Putty) IMPLANT
ROD STRT TI 6.35X100 (Rod) IMPLANT
SCREW PA DLX CREO 7.5X50 (Screw) IMPLANT
SEALANT ADHERUS EXTEND TIP (MISCELLANEOUS) IMPLANT
SPIKE FLUID TRANSFER (MISCELLANEOUS) ×1 IMPLANT
SPONGE NEURO XRAY DETECT 1X3 (DISPOSABLE) IMPLANT
SPONGE SURGIFOAM ABS GEL 100 (HEMOSTASIS) IMPLANT
SPONGE T-LAP 4X18 ~~LOC~~+RFID (SPONGE) IMPLANT
STRIP CLOSURE SKIN 1/2X4 (GAUZE/BANDAGES/DRESSINGS) ×1 IMPLANT
SUT PROLENE 6 0 BV (SUTURE) IMPLANT
SUT VIC AB 1 CT1 18XBRD ANBCTR (SUTURE) ×2 IMPLANT
SUT VIC AB 2-0 CP2 18 (SUTURE) ×2 IMPLANT
SYR 20ML LL LF (SYRINGE) IMPLANT
TOWEL GREEN STERILE (TOWEL DISPOSABLE) ×1 IMPLANT
TOWEL GREEN STERILE FF (TOWEL DISPOSABLE) ×1 IMPLANT
TRAY FOLEY MTR SLVR 16FR STAT (SET/KITS/TRAYS/PACK) ×1 IMPLANT
WATER STERILE IRR 1000ML POUR (IV SOLUTION) ×1 IMPLANT

## 2023-04-24 NOTE — Transfer of Care (Signed)
Immediate Anesthesia Transfer of Care Note  Patient: Kaitlyn Allen  Procedure(s) Performed: Posterior Lumbar Interbody Fusion Lumbar two-three;Lumbar three-four,Lumbar four-five POSTERIOR LATERAL ARTHRODESIS; POSTERIOR INSTRUMENTATION Lumbar two-five (Spine Lumbar)  Patient Location: PACU  Anesthesia Type:General  Level of Consciousness: awake and drowsy  Airway & Oxygen Therapy: Patient Spontanous Breathing  Post-op Assessment: Report given to RN and Post -op Vital signs reviewed and stable  Post vital signs: Reviewed and stable  Last Vitals:  Vitals Value Taken Time  BP 125/63 04/24/23 1943  Temp    Pulse 105 04/24/23 1944  Resp 21 04/24/23 1944  SpO2 99 % 04/24/23 1944  Vitals shown include unfiled device data.  Last Pain:  Vitals:   04/24/23 0933  TempSrc:   PainSc: 0-No pain         Complications: No notable events documented.

## 2023-04-24 NOTE — H&P (Signed)
Subjective: The patient is a 77 year old white female who has complained of back pain with pain numbness and tingling into her bilateral buttocks and legs equally when she stands and walks consistent with neurogenic claudication.  She has failed medical management.  She was worked up with a lumbar MRI which demonstrated a degenerative scoliosis, listhesis and spinal stenosis.  I discussed the various treatment options with her.  She has decided proceed with surgery.  Past Medical History:  Diagnosis Date   Cancer (HCC)    thyroid   Diabetes mellitus    Hyperlipidemia    Hypertension    NSTEMI (non-ST elevated myocardial infarction) (HCC) 12/2010   pulmonary edema & NSTEMI post-thyroidectomy; 12/31/10 [facility]: Normal coronaries, moderate LV dysfunction, EF 40%   Osteopenia    Scleroderma (HCC)    Scoliosis    Takotsubo cardiomyopathy 12/2010   post-thyroidectomy   Thyroid goiter    s/p thyroidectomy    Past Surgical History:  Procedure Laterality Date   CARDIAC CATHETERIZATION  12/31/10   Left ventricular angiography performed in the RAO view demonstrates  moderate-to-severe hypokinesis involving the mid-to-distal anterior  wall, distal inferior wall, and apex.  Overall, systolic function is  moderately reduced with ejection fraction estimated at 40%.    radioactiveiodine     thyrotoxicosis 1999   RIGHT HEART CATH N/A 03/12/2018   Procedure: RIGHT HEART CATH;  Surgeon: Dolores Patty, MD;  Location: [facility];  Service: Cardiovascular;  Laterality: N/A;   THYROIDECTOMY     12/28/2010   TRANSTHORACIC ECHOCARDIOGRAM  12/30/2010   Left ventricle: LVEF is approximately 25 to 30% with hypokinesis  of the distal inferoseptal, mid/distal lateral, mid/distal   anterior, distal inferior, mid/distal mid/distal posterior and   apical walls   TRANSTHORACIC ECHOCARDIOGRAM  10/28/2010   Left ventricle: The cavity size was normal. There was mild concentric hypertrophy. Systolic function was  lower limits of normal. The estimated ejection fraction was in the range of 50% to 55%.     Allergies  Allergen Reactions   Sulfa Drugs Cross Reactors Hives    Happened in childhood, believes they were all over the body.    Social History   Tobacco Use   Smoking status: Never   Smokeless tobacco: Never  Substance Use Topics   Alcohol use: No    Family History  Problem Relation Age of Onset   Breast cancer Mother 65   Cancer Mother        breast   Arthritis Sister    Heart failure Father    Prior to Admission medications   Medication Sig Start Date End Date Taking? Authorizing Provider  atorvastatin (LIPITOR) 40 MG tablet Take 40 mg by mouth daily.   Yes [provider]  B Complex-C (SUPER B COMPLEX PO) Take 1 tablet by mouth daily.   Yes [provider]  Calcium-Phosphorus-Vitamin D (CALCIUM GUMMIES PO) Take 2 each by mouth daily.   Yes [provider]  Cholecalciferol (VITAMIN D-3) 25 MCG (1000 UT) CAPS Take 1,000 Units by mouth daily.   Yes [provider]  enalapril (VASOTEC) 20 MG tablet Take 20 mg by mouth daily.    Yes [provider]  gabapentin (NEURONTIN) 100 MG capsule Take 100 mg by mouth 3 (three) times daily. 02/28/23  Yes [provider]  hydrochlorothiazide (MICROZIDE) 12.5 MG capsule Take 12.5 mg by mouth daily.   Yes [provider]  levothyroxine (SYNTHROID) 100 MCG tablet Take 100 mcg by mouth daily  before breakfast. 02/18/11  Yes [provider]  loratadine (CLARITIN) 10 MG tablet Take 10 mg by mouth daily as needed for allergies.   Yes [provider]  Magnesium 250 MG TABS Take 250 mg by mouth daily.   Yes [provider]  Multiple Vitamins-Minerals (MULTIVITAMIN GUMMIES ADULT PO) Take 2 each by mouth daily.   Yes [provider]  Omega-3 Fatty Acids (FISH OIL PO) Take 1,400 mg by mouth daily.   Yes [provider]     Review of Systems  Positive  ROS: As above  All other systems have been reviewed and were otherwise negative with the exception of those mentioned in the HPI and as above.  Objective: Vital signs in last 24 hours: Temp:  [97.6 F (36.4 C)] 97.6 F (36.4 C) (12/16 0856) Pulse Rate:  [99] 99 (12/16 0856) Resp:  [18] 18 (12/16 0856) BP: (130)/(70) 130/70 (12/16 0856) SpO2:  [97 %] 97 % (12/16 0856) Weight:  [56.2 kg] 56.2 kg (12/16 0856) Estimated body mass index is 20.63 kg/m as calculated from the following:   Height as of this encounter: 5\' 5"  (1.651 m).   Weight as of this encounter: 56.2 kg.   General Appearance: Alert Head: Normocephalic, without obvious abnormality, atraumatic Eyes: PERRL, conjunctiva/corneas clear, EOM's intact,    Ears: Normal  Throat: Normal  Neck: Supple, Back: unremarkable Lungs: Clear to auscultation bilaterally, respirations unlabored Heart: Regular rate and rhythm, no murmur, rub or gallop Abdomen: Soft, non-tender Extremities: Extremities normal, atraumatic, no cyanosis or edema Skin: unremarkable  NEUROLOGIC:   Mental status: alert and oriented,Motor Exam -she has mild weakness in her bilateral hands. Sensory Exam - grossly normal Reflexes:  Coordination - grossly normal Gait - grossly normal Balance - grossly normal Cranial Nerves: I: smell Not tested  II: visual acuity  OS: Normal  OD: Normal   II: visual fields Full to confrontation  II: pupils Equal, round, reactive to light  III,VII: ptosis None  III,IV,VI: extraocular muscles  Full ROM  V: mastication Normal  V: facial light touch sensation  Normal  V,VII: corneal reflex  Present  VII: facial muscle function - upper  Normal  VII: facial muscle function - lower Normal  VIII: hearing Not tested  IX: soft palate elevation  Normal  IX,X: gag reflex Present  XI: trapezius strength  5/5  XI: sternocleidomastoid strength 5/5  XI: neck flexion strength  5/5  XII: tongue strength  Normal    Data  Review Lab Results  Component Value Date   WBC 8.9 04/18/2023   HGB 9.9 (L) 04/18/2023   HCT 31.3 (L) 04/18/2023   MCV 90.2 04/18/2023   PLT 505 (H) 04/18/2023   Lab Results  Component Value Date   NA 136 04/18/2023   K 4.2 04/18/2023   CL 106 04/18/2023   CO2 22 04/18/2023   BUN 34 (H) 04/18/2023   CREATININE 1.41 (H) 04/18/2023   GLUCOSE 114 (H) 04/18/2023   Lab Results  Component Value Date   INR 1.02 02/23/2018    Assessment/Plan: Adult degenerative scoliosis, lumbar spinal stenosis, lumbar spinal stenosis, neurogenic claudication, lumbar radiculopathy: I discussed situation with the patient.  I reviewed her imaging studies with her and pointed out the abnormalities.  We have discussed the various treatment options including surgery.  I described the surgical treatment option of an L2-3, 3 4 and L4-5 decompression, instrumentation and fusion.  I have shown her surgical models.  I have given her a surgical  pamphlet.  We have discussed the risk, benefits, alternatives, expected postop course, and likelihood of achieving our goals with surgery.  I have answered all her questions.  She has decided proceed with surgery.   Cristi Loron 04/24/2023 11:01 AM

## 2023-04-24 NOTE — Anesthesia Postprocedure Evaluation (Signed)
Anesthesia Post Note  Patient: Kaitlyn Allen  Procedure(s) Performed: Posterior Lumbar Interbody Fusion Lumbar two-three;Lumbar three-four,Lumbar four-five POSTERIOR LATERAL ARTHRODESIS; POSTERIOR INSTRUMENTATION Lumbar two-five (Spine Lumbar)     Patient location during evaluation: PACU Anesthesia Type: General Level of consciousness: sedated, patient cooperative and oriented Pain management: pain level controlled Vital Signs Assessment: post-procedure vital signs reviewed and stable Respiratory status: spontaneous breathing, nonlabored ventilation, respiratory function stable and patient connected to nasal cannula oxygen Cardiovascular status: blood pressure returned to baseline and stable Postop Assessment: no apparent nausea or vomiting Anesthetic complications: no   No notable events documented.  Last Vitals:  Vitals:   04/24/23 2045 04/24/23 2106  BP: 103/65 116/72  Pulse: 100 86  Resp: (!) 24 17  Temp: 36.7 C (!) 36.3 C  SpO2: 100% 100%    Last Pain:  Vitals:   04/24/23 2106  TempSrc: Oral  PainSc:                  Brenda Cowher,E. Vyron Fronczak

## 2023-04-24 NOTE — Op Note (Signed)
Brief history: The patient is a 77 year old white female who is complaining of back and leg pain consistent with a lumbosacral radiculopathy/neurogenic claudication.  She failed medical management.  She was worked up with a lumbar MRI lumbar x-rays which demonstrated scoliosis, spondylolisthesis, spinal stenosis, etc.  I discussed the various treatment options with her.  She has decided proceed with surgery.  Preoperative diagnosis: Adult degenerative scoliosis, lumbar degenerative disc disease, lumbar spinal stenosis compressing the bilateral L2, L3, L4, and L5 nerve roots; lumbago; lumbar radiculopathy; neurogenic claudication  Postoperative diagnosis: As above  Procedure: Bilateral L2-3, L3-4 and L4-5 laminotomy/foraminotomies/medial facetectomy to decompress the bilateral L2, L3, L4 and L5 nerve roots(the work required to do this was in addition to the work required to do the posterior lumbar interbody fusion because of the patient's spinal stenosis, generative scoliosis, facet arthropathy. Etc. requiring a wide decompression of the nerve roots.); right L2-3 and left L4-5 transforaminal lumbar interbody fusion with local morselized autograft bone and Zimmer DBM; insertion of interbody prosthesis at L2-3 and L4-5 (globus peek expandable interbody prosthesis); posterior segmental instrumentation from L2-L5 with globus titanium pedicle screws and rods; posterior lateral arthrodesis at L2-3, L3-4 and L4-5 with local morselized autograft bone and Zimmer DBM.  Surgeon: Dr. Delma Officer  Asst.: Dr. Barnett Abu and Hildred Priest, NP  Anesthesia: Gen. endotracheal  Estimated blood loss: 250 cc  Drains: 1 medium Hemovac drain in the epidural space  Complications: None  Description of procedure: The patient was brought to the operating room by the anesthesia team. General endotracheal anesthesia was induced. The patient was turned to the prone position on the [place] table. The patient's lumbosacral  region was then prepared with Betadine scrub and Betadine solution. Sterile drapes were applied.  I then injected the area to be incised with Marcaine with epinephrine solution. I then used the scalpel to make a linear midline incision over the 2 3, L3-4 and L4-5 interspace. I then used electrocautery to perform a bilateral subperiosteal dissection exposing the spinous process and lamina of 2 3, L3-4 and L4-5. We then obtained intraoperative radiograph to confirm our location. We then inserted the Verstrac retractor to provide exposure.  I began the decompression by using the high speed drill to perform laminotomies at L2-3, L3-4 and L4-5 bilaterally. We then used the Kerrison punches to widen the laminotomy and removed the ligamentum flavum at L2-3, L3-4 and L4-5 bilaterally. We used the Kerrison punches to remove the medial facets at L2-3, L3-4 and L4-5 bilaterally. We performed wide foraminotomies about the bilateral L2, L3, L4 and L5 nerve roots completing the decompression.  During the decompression we created 2 durotomy's which we closed with interrupted 6-0 Prolene sutures.  We now turned our attention to the posterior lumbar interbody fusion. I used a scalpel to incise the intervertebral disc at L2-3 on the right and L4-5 on the left.  The disc space at L3-4 was fused.  I then performed a partial intervertebral discectomy at 2 3 and L4-5 using the pituitary forceps. We prepared the vertebral endplates at 2 3 and L4-5 for the fusion by removing the soft tissues with the curettes. We then used the trial spacers to pick the appropriate sized interbody prosthesis. We prefilled his prosthesis with a combination of local morselized autograft bone that we obtained during the decompression as well as Zimmer DBM. We inserted the prefilled prosthesis into the interspace at 2 3 and L4-5.  We then expanded the prosthesis. There was a good snug fit  of the prosthesis in the interspace. We then filled and the  remainder of the intervertebral disc space with local morselized autograft bone and Zimmer DBM. This completed the posterior lumbar interbody arthrodesis.  During the decompression and insertion of the prosthesis the assistant protected the thecal sac and nerve roots with the D'Errico retractor.  We now turned attention to the instrumentation. Under fluoroscopic guidance we cannulated the bilateral L2, L3, L4 and L5 pedicles with the bone probe. We then removed the bone probe. We then tapped the pedicle with a 6.5 millimeter tap. We then removed the tap. We probed inside the tapped pedicle with a ball probe to rule out cortical breaches. We then inserted a 7.5 x 50 millimeter pedicle screw into the L2, L3, L4 and L5 pedicles bilaterally under fluoroscopic guidance. We then palpated along the medial aspect of the pedicles to rule out cortical breaches. There were none. The nerve roots were not injured. We then connected the unilateral pedicle screws with a lordotic rod. We compressed the construct and secured the rod in place with the caps. We then tightened the caps appropriately. This completed the instrumentation from L2-L5 bilaterally.  We now turned our attention to the posterior lateral arthrodesis at L2-3, L3-4 and L4-5. We used the high-speed drill to decorticate the remainder of the facets, pars, transverse process at L2-3, L3-4 and L4-5. We then applied a combination of local morselized autograft bone, Trinity bone graft expander and Zimmer DBM over these decorticated posterior lateral structures. This completed the posterior lateral arthrodesis at L2-3, L3-4 and L4-5  We then obtained hemostasis using bipolar electrocautery. We irrigated the wound out with vashe solution. We inspected the thecal sac and nerve roots and noted they were well decompressed. We then removed the retractor.  We placed a medium Hemovac drain in the epidural space and tunneled it out through a separate stab wound.  We  reapproximated patient's thoracolumbar fascia with interrupted #1 Vicryl suture. We reapproximated patient's subcutaneous tissue with interrupted 2-0 Vicryl suture. The reapproximated patient's skin with Steri-Strips and benzoin. The wound was then coated with bacitracin ointment. A sterile dressing was applied. The drapes were removed. The patient was subsequently returned to the supine position where they were extubated by the anesthesia team. He was then transported to the post anesthesia care unit in stable condition. All sponge instrument and needle counts were reportedly correct at the end of this case.

## 2023-04-25 ENCOUNTER — Other Ambulatory Visit (HOSPITAL_COMMUNITY): Payer: Self-pay

## 2023-04-25 ENCOUNTER — Other Ambulatory Visit: Payer: Self-pay

## 2023-04-25 DIAGNOSIS — M4156 Other secondary scoliosis, lumbar region: Secondary | ICD-10-CM | POA: Diagnosis not present

## 2023-04-25 DIAGNOSIS — I10 Essential (primary) hypertension: Secondary | ICD-10-CM | POA: Diagnosis not present

## 2023-04-25 DIAGNOSIS — M5116 Intervertebral disc disorders with radiculopathy, lumbar region: Secondary | ICD-10-CM | POA: Diagnosis not present

## 2023-04-25 DIAGNOSIS — M48062 Spinal stenosis, lumbar region with neurogenic claudication: Secondary | ICD-10-CM | POA: Diagnosis not present

## 2023-04-25 DIAGNOSIS — E119 Type 2 diabetes mellitus without complications: Secondary | ICD-10-CM | POA: Diagnosis not present

## 2023-04-25 DIAGNOSIS — D62 Acute posthemorrhagic anemia: Secondary | ICD-10-CM | POA: Diagnosis not present

## 2023-04-25 LAB — GLUCOSE, CAPILLARY
Glucose-Capillary: 136 mg/dL — ABNORMAL HIGH (ref 70–99)
Glucose-Capillary: 81 mg/dL (ref 70–99)
Glucose-Capillary: 116 mg/dL — ABNORMAL HIGH (ref 70–99)
Glucose-Capillary: 120 mg/dL — ABNORMAL HIGH (ref 70–99)

## 2023-04-25 LAB — BASIC METABOLIC PANEL
Anion gap: 11 (ref 5–15)
BUN: 31 mg/dL — ABNORMAL HIGH (ref 8–23)
CO2: 19 mmol/L — ABNORMAL LOW (ref 22–32)
Calcium: 7.1 mg/dL — ABNORMAL LOW (ref 8.9–10.3)
Chloride: 106 mmol/L (ref 98–111)
Creatinine, Ser: 1.6 mg/dL — ABNORMAL HIGH (ref 0.44–1.00)
GFR, Estimated: 33 mL/min — ABNORMAL LOW (ref >60)
Glucose, Bld: 119 mg/dL — ABNORMAL HIGH (ref 70–99)
Potassium: 4 mmol/L (ref 3.5–5.1)
Sodium: 136 mmol/L (ref 135–145)

## 2023-04-25 LAB — CBC
HCT: 20.9 % — ABNORMAL LOW (ref 36.0–46.0)
Hemoglobin: 6.7 g/dL — CL (ref 12.0–15.0)
MCH: 28.8 pg (ref 26.0–34.0)
MCHC: 32.1 g/dL (ref 30.0–36.0)
MCV: 89.7 fL (ref 80.0–100.0)
Platelets: 299 10*3/uL (ref 150–400)
RBC: 2.33 MIL/uL — ABNORMAL LOW (ref 3.87–5.11)
RDW: 13.6 % (ref 11.5–15.5)
WBC: 9.2 10*3/uL (ref 4.0–10.5)
nRBC: 0 % (ref 0.0–0.2)

## 2023-04-25 LAB — HEMOGLOBIN AND HEMATOCRIT, BLOOD
HCT: 26.3 % — ABNORMAL LOW (ref 36.0–46.0)
Hemoglobin: 8.6 g/dL — ABNORMAL LOW (ref 12.0–15.0)

## 2023-04-25 LAB — PREPARE RBC (CROSSMATCH)

## 2023-04-25 MED ORDER — SODIUM CHLORIDE 0.9% IV SOLUTION
Freq: Once | INTRAVENOUS | Status: AC
Start: 2023-04-25 — End: 2023-04-25

## 2023-04-25 MED ORDER — DOCUSATE SODIUM 100 MG PO CAPS
100.0000 mg | ORAL_CAPSULE | Freq: Two times a day (BID) | ORAL | 0 refills | Status: DC
  Filled 2023-04-25: qty 30, 15d supply, fill #0

## 2023-04-25 MED ORDER — SODIUM CHLORIDE 0.9% IV SOLUTION: Freq: Once | INTRAVENOUS | Status: AC

## 2023-04-25 MED ORDER — OXYCODONE-ACETAMINOPHEN 5-325 MG PO TABS
1.0000 | ORAL_TABLET | ORAL | 0 refills | Status: DC | PRN
  Filled 2023-04-25: qty 30, 3d supply, fill #0

## 2023-04-25 MED ORDER — CYCLOBENZAPRINE HCL 5 MG PO TABS
5.0000 mg | ORAL_TABLET | Freq: Three times a day (TID) | ORAL | 0 refills | Status: DC | PRN
  Filled 2023-04-25: qty 30, 10d supply, fill #0

## 2023-04-25 MED FILL — Thrombin For Soln 5000 Unit: CUTANEOUS | Qty: 5000 | Status: AC

## 2023-04-25 NOTE — Discharge Instructions (Signed)
Wound Care Keep incision covered and dry until post op day 3. You may remove the Honeycomb dressing on post op day 3. Leave steri-strips on back.  They will fall off by themselves. Do not put any creams, lotions, or ointments on incision. You are fine to shower. Let water run over incision and pat dry.  Activity Walk each and every day, increasing distance each day. No lifting greater than 5 lbs.  Avoid excessive back motion. No driving for 2 weeks; may ride as a passenger locally.  Diet Resume your normal diet.  Call Your Doctor If Any of These Occur Redness, drainage, or swelling at the wound.  Temperature greater than 101 degrees. Severe pain not relieved by pain medication. Incision starts to come apart.  Follow Up Appt Call [phone] today for appointment in 2-3 weeks if you don't already have one or for any problems.  If you have any hardware placed in your spine, you will need an x-ray before your appointment.  

## 2023-04-25 NOTE — Discharge Summary (Signed)
Physician Discharge Summary     Providing Compassionate, Quality Care - Together   Patient ID: Kaitlyn Allen MRN: [id number] DOB/AGE: 05-17-1945 77 y.o.  Admit date: 04/24/2023 Discharge date: 04/25/2023  Admission Diagnoses:  Discharge Diagnoses:  Principal Problem:   Lumbar scoliosis   Discharged Condition: {condition:18240}  Hospital Course: Patient underwent a ***  by Dr. Marland Kitchen on ***. *** was admitted to ***  following recovery from anesthesia in the PACU. *** postoperative course has been uncomplicated. *** has worked with both physical and occupational therapies who feel the patient is ready for discharge home. *** is ambulating independently and without difficulty. *** is tolerating a normal diet. *** is not having any bowel or bladder dysfunction. *** pain is well-controlled with oral pain medication. *** is ready for discharge ***.   Consults: {consultation:18241}  Significant Diagnostic Studies: {diagnostics:18242}  Treatments: {Tx:18249}  Discharge Exam: Blood pressure 111/62, pulse 81, temperature 98 F (36.7 C), temperature source Oral, resp. rate 16, height 5\' 5"  (1.651 m), weight 56.2 kg, SpO2 100%. {physical WUXL:2440102}  Disposition: Discharge disposition: 01-Home or Self Care       Discharge Instructions     Call MD for:  difficulty breathing, headache or visual disturbances   Complete by: As directed    Call MD for:  hives   Complete by: As directed    Call MD for:  persistant dizziness or light-headedness   Complete by: As directed    Call MD for:  persistant nausea and vomiting   Complete by: As directed    Call MD for:  redness, tenderness, or signs of infection (pain, swelling, redness, odor or green/yellow discharge around incision site)   Complete by: As directed    Call MD for:  severe uncontrolled pain   Complete by: As directed    Diet - low sodium heart healthy   Complete by: As directed    If the dressing is still on your  incision site when you go home, remove it on the third day after your surgery date. Remove dressing if it begins to fall off, or if it is dirty or damaged before the third day.   Complete by: As directed    Increase activity slowly   Complete by: As directed       Allergies as of 04/25/2023       Reactions   Sulfa Drugs Cross Reactors Hives   Happened in childhood, believes they were all over the body.        Medication List     TAKE these medications    atorvastatin 40 MG tablet Commonly known as: LIPITOR Take 40 mg by mouth daily.   CALCIUM GUMMIES PO Take 2 each by mouth daily.   cyclobenzaprine 5 MG tablet Commonly known as: FLEXERIL Take 1 tablet (5 mg total) by mouth 3 (three) times daily as needed for muscle spasms.   docusate sodium 100 MG capsule Commonly known as: COLACE Take 1 capsule (100 mg total) by mouth 2 (two) times daily.   enalapril 20 MG tablet Commonly known as: VASOTEC Take 20 mg by mouth daily.   FISH OIL PO Take 1,400 mg by mouth daily.   gabapentin 100 MG capsule Commonly known as: NEURONTIN Take 100 mg by mouth 3 (three) times daily.   hydrochlorothiazide 12.5 MG capsule Commonly known as: MICROZIDE Take 12.5 mg by mouth daily.   levothyroxine 100 MCG tablet Commonly known as: SYNTHROID Take 100 mcg by mouth daily before breakfast.   loratadine  10 MG tablet Commonly known as: CLARITIN Take 10 mg by mouth daily as needed for allergies.   Magnesium 250 MG Tabs Take 250 mg by mouth daily.   MULTIVITAMIN GUMMIES ADULT PO Take 2 each by mouth daily.   oxyCODONE-acetaminophen 5-325 MG tablet Commonly known as: Percocet Take 1-2 tablets by mouth every 4 (four) hours as needed.   SUPER B COMPLEX PO Take 1 tablet by mouth daily.   Vitamin D-3 25 MCG (1000 UT) Caps Take 1,000 Units by mouth daily.               Discharge Care Instructions  (From admission, onward)           Start     Ordered   04/25/23 0000  If  the dressing is still on your incision site when you go home, remove it on the third day after your surgery date. Remove dressing if it begins to fall off, or if it is dirty or damaged before the third day.        04/25/23 0981            Follow-up Information     Tressie Stalker, MD. Go in 3 week(s).   Specialty: Neurosurgery Contact information: [address] [place] [address] [phone]                 Signed: Val Eagle, DNP, AGNP-C Nurse Practitioner  [facility] [address], [place], [place] [address] P: [phone]    F: [phone]  04/25/2023, 9:33 AM

## 2023-04-25 NOTE — Progress Notes (Signed)
Subjective: The patient is alert and pleasant.  She looks well.  She wants to go home.  Her daughter is at the bedside.  Objective: Vital signs in last 24 hours: Temp:  [97.3 F (36.3 C)-98.1 F (36.7 C)] 97.9 F (36.6 C) (12/17 0530) Pulse Rate:  [86-105] 92 (12/17 0530) Resp:  [15-24] 18 (12/17 0530) BP: (93-130)/(65-80) 109/65 (12/17 0530) SpO2:  [94 %-100 %] 100 % (12/17 0530) Weight:  [56.2 kg] 56.2 kg (12/16 0856) Estimated body mass index is 20.63 kg/m as calculated from the following:   Height as of this encounter: 5\' 5"  (1.651 m).   Weight as of this encounter: 56.2 kg.   Intake/Output from previous day: 12/16 0701 - 12/17 0700 In: 2791 [P.O.:75; I.V.:2000; Blood:116; IV Piggyback:600] Out: 1445 [Urine:750; Drains:275; Blood:420] Intake/Output this shift: No intake/output data recorded.  Physical exam the patient is alert and pleasant.  She looks well.  Her strength is normal.  Lab Results: No results for input(s): "WBC", "HGB", "HCT", "PLT" in the last 72 hours. BMET No results for input(s): "NA", "K", "CL", "CO2", "GLUCOSE", "BUN", "CREATININE", "CALCIUM" in the last 72 hours.  Studies/Results: DG Lumbar Spine 2-3 Views Result Date: 04/24/2023 CLINICAL DATA:  [id number] Elective surgery [id number] EXAM: LUMBAR SPINE - 2-3 VIEW COMPARISON:  None Available. FINDINGS: Two fluoroscopic spot views of the lumbar spine obtained in the operating room. Pedicle screws L2, L3, L4, and L5. L2-L3 and L4-L5 interbody spacers. Fluoroscopy time 36 seconds. Dose 13.39 mGy. IMPRESSION: Intraoperative fluoroscopy during lumbar fusion. Electronically Signed   By: Narda Rutherford M.D.   On: 04/24/2023 22:21   DG Lumbar Spine 1 View Result Date: 04/24/2023 CLINICAL DATA:  Elective surgery, intraop localization. EXAM: LUMBAR SPINE - 1 VIEW COMPARISON:  None Available. FINDINGS: Three portable cross-table lateral views of the lumbar spine obtained in the operating room. Five non-rib-bearing  lumbar vertebra are assumed. Film 1 demonstrates surgical instrument localizing posteriorly at the L4-L5 level. Film 2 demonstrates surgical instrument localizing posteriorly at the L5-S1 level. Film 3 demonstrates surgical instrument localizing posteriorly at the L4-L5 level. IMPRESSION: Intraoperative localization during lumbar spine surgery. Electronically Signed   By: Narda Rutherford M.D.   On: 04/24/2023 22:20   DG C-Arm 1-60 Min-No Report Result Date: 04/24/2023 Fluoroscopy was utilized by the requesting physician.  No radiographic interpretation.   DG C-Arm 1-60 Min-No Report Result Date: 04/24/2023 Fluoroscopy was utilized by the requesting physician.  No radiographic interpretation.    Assessment/Plan: Postop day #1: The patient is doing well.  We will likely discharge her after she works with PT and check her labs.  I gave her her discharge instructions and answered all her questions.  LOS: 0 days     Cristi Loron 04/25/2023, 7:46 AM

## 2023-04-25 NOTE — Progress Notes (Signed)
Orthopedic Tech Progress Note Patient Details:  Kaitlyn Allen Apr 13, 1946 [id number]  Ortho Devices Type of Ortho Device: Lumbar corsett Ortho Device/Splint Interventions: Ordered, Application, Adjustment   Post Interventions Patient Tolerated: Well Instructions Provided: Adjustment of device, Care of device  [name] Post 04/25/2023, 5:39 AM

## 2023-04-25 NOTE — Evaluation (Signed)
Physical Therapy Evaluation Patient Details Name: Kaitlyn Allen MRN: [id number] DOB: 10-Jan-1946 Today's Date: 04/25/2023  History of Present Illness  77 yo female s/p PLIF L2-5 on 12/16. PRBC transfusion 12/17. PMH includes thyroid cancer, DM, HLD, HTN, NSTEMI, osteopenia, scleroderma, scoliosis.  Clinical Impression   Pt presents with min back pain post-op, impaired balance, decreased knowledge and application of spinal precautions, and decreased activity tolerance. Pt to benefit from acute PT to address deficits. Pt ambulated good hallway distance with use of RW, no reports of dizziness or lightheadedness s/p transfusion PRBC. PT to progress mobility as tolerated, and will continue to follow acutely.          If plan is discharge home, recommend the following: A little help with bathing/dressing/bathroom;A little help with walking and/or transfers   Can travel by private vehicle        Equipment Recommendations BSC/3in1  Recommendations for Other Services       Functional Status Assessment Patient has had a recent decline in their functional status and demonstrates the ability to make significant improvements in function in a reasonable and predictable amount of time.     Precautions / Restrictions Precautions Precautions: Fall;Back Precaution Booklet Issued: Yes (comment) Precaution Comments: reviewed BLT rules Required Braces or Orthoses: Spinal Brace Spinal Brace: Lumbar corset;Applied in sitting position Restrictions Weight Bearing Restrictions Per Provider Order: No      Mobility  Bed Mobility Overal bed mobility: Needs Assistance Bed Mobility: Rolling, Sidelying to Sit Rolling: Supervision Sidelying to sit: Supervision, Used rails       General bed mobility comments: cues for log roll techique, use of bedrails for assist    Transfers Overall transfer level: Needs assistance Equipment used: Rolling walker (2 wheels) Transfers: Sit to/from Stand Sit to  Stand: Min assist           General transfer comment: light rise and steady assist, cues for correct hand placement    Ambulation/Gait Ambulation/Gait assistance: Contact guard assist Gait Distance (Feet): 350 Feet Assistive device: Rolling walker (2 wheels) Gait Pattern/deviations: Step-through pattern, Decreased stride length, Trunk flexed Gait velocity: decr     General Gait Details: cues for upright posture, placement in RW  Stairs            Wheelchair Mobility     Tilt Bed    Modified Rankin (Stroke Patients Only)       Balance Overall balance assessment: Needs assistance Sitting-balance support: No upper extremity supported, Feet supported Sitting balance-Leahy Scale: Good     Standing balance support: Bilateral upper extremity supported, During functional activity Standing balance-Leahy Scale: Fair Standing balance comment: can ambulate short distance without AD but reaching for environment to self-steady                             Pertinent Vitals/Pain Pain Assessment Pain Assessment: Faces Faces Pain Scale: Hurts a little bit Pain Location: back Pain Descriptors / Indicators: Tender Pain Intervention(s): Limited activity within patient's tolerance, Monitored during session, Repositioned    Home Living Family/patient expects to be discharged to:: Private residence Living Arrangements: Alone Available Help at Discharge: Family (daughter to stay with her over the first few days as needed) Type of Home: House Home Access: Stairs to enter Entrance Stairs-Rails: None Entrance Stairs-Number of Steps: 3   Home Layout: One level Home Equipment: Agricultural consultant (2 wheels);Other (comment) (rails alongside of toilet)      Prior Function Prior Level  of Function : Independent/Modified Independent                     Extremity/Trunk Assessment   Upper Extremity Assessment Upper Extremity Assessment: Defer to OT evaluation     Lower Extremity Assessment Lower Extremity Assessment: Overall WFL for tasks assessed (at least 3/5 throughout, no focal weakness, MMT not formally assessed postop)    Cervical / Trunk Assessment Cervical / Trunk Assessment: Back Surgery  Communication   Communication Communication: No apparent difficulties Cueing Techniques: Verbal cues;Gestural cues  Cognition Arousal: Alert Behavior During Therapy: WFL for tasks assessed/performed Overall Cognitive Status: Within Functional Limits for tasks assessed                                          General Comments  Home walking program: up and walking 1x/hour during waking hours for short household distances with supervision of family, to promote circulation, activity tolerance, and strength maintenance.      Exercises     Assessment/Plan    PT Assessment Patient needs continued PT services  PT Problem List Decreased strength;Decreased balance;Decreased range of motion;Decreased activity tolerance;Decreased safety awareness;Cardiopulmonary status limiting activity;Decreased cognition;Decreased mobility;Decreased knowledge of precautions       PT Treatment Interventions DME instruction;Therapeutic activities;Gait training;Therapeutic exercise;Patient/family education;Balance training;Stair training;Functional mobility training;Neuromuscular re-education    PT Goals (Current goals can be found in the Care Plan section)  Acute Rehab PT Goals Patient Stated Goal: home PT Goal Formulation: With patient Time For Goal Achievement: 05/09/23 Potential to Achieve Goals: Good    Frequency Min 5X/week     Co-evaluation               AM-PAC PT "6 Clicks" Mobility  Outcome Measure Help needed turning from your back to your side while in a flat bed without using bedrails?: A Little Help needed moving from lying on your back to sitting on the side of a flat bed without using bedrails?: A Little Help needed  moving to and from a bed to a chair (including a wheelchair)?: A Little Help needed standing up from a chair using your arms (e.g., wheelchair or bedside chair)?: A Little Help needed to walk in hospital room?: A Little Help needed climbing 3-5 steps with a railing? : A Little 6 Click Score: 18    End of Session Equipment Utilized During Treatment: Back brace Activity Tolerance: Patient tolerated treatment well Patient left: in bed;with call bell/phone within reach;with family/visitor present Nurse Communication: Mobility status PT Visit Diagnosis: Other abnormalities of gait and mobility (R26.89);Muscle weakness (generalized) (M62.81)    Time: 5621-3086 PT Time Calculation (min) (ACUTE ONLY): 38 min   Charges:   PT Evaluation $PT Eval Low Complexity: 1 Low PT Treatments $Therapeutic Activity: 8-22 mins PT General Charges $$ ACUTE PT VISIT: 1 Visit         Marye Round, PT DPT Acute Rehabilitation Services Secure Chat Preferred  Office [phone]   Emary Zalar Sheliah Plane 04/25/2023, 5:26 PM

## 2023-04-25 NOTE — Progress Notes (Signed)
Lab called patient hemoglobin 6.7, HCT 20.9. MD notified via text message. Awaiting MD response.

## 2023-04-26 DIAGNOSIS — M5116 Intervertebral disc disorders with radiculopathy, lumbar region: Secondary | ICD-10-CM | POA: Diagnosis not present

## 2023-04-26 DIAGNOSIS — M4156 Other secondary scoliosis, lumbar region: Secondary | ICD-10-CM | POA: Diagnosis not present

## 2023-04-26 DIAGNOSIS — E119 Type 2 diabetes mellitus without complications: Secondary | ICD-10-CM | POA: Diagnosis not present

## 2023-04-26 DIAGNOSIS — D62 Acute posthemorrhagic anemia: Secondary | ICD-10-CM | POA: Diagnosis not present

## 2023-04-26 DIAGNOSIS — M48062 Spinal stenosis, lumbar region with neurogenic claudication: Secondary | ICD-10-CM | POA: Diagnosis not present

## 2023-04-26 DIAGNOSIS — I10 Essential (primary) hypertension: Secondary | ICD-10-CM | POA: Diagnosis not present

## 2023-04-26 LAB — BASIC METABOLIC PANEL
Anion gap: 15 (ref 5–15)
BUN: 27 mg/dL — ABNORMAL HIGH (ref 8–23)
CO2: 20 mmol/L — ABNORMAL LOW (ref 22–32)
Calcium: 7.7 mg/dL — ABNORMAL LOW (ref 8.9–10.3)
Chloride: 103 mmol/L (ref 98–111)
Creatinine, Ser: 1.36 mg/dL — ABNORMAL HIGH (ref 0.44–1.00)
GFR, Estimated: 40 mL/min — ABNORMAL LOW (ref >60)
Glucose, Bld: 141 mg/dL — ABNORMAL HIGH (ref 70–99)
Potassium: 3.7 mmol/L (ref 3.5–5.1)
Sodium: 138 mmol/L (ref 135–145)

## 2023-04-26 LAB — TYPE AND SCREEN
ABO/RH(D): O POS
Antibody Screen: NEGATIVE
Unit division: 0

## 2023-04-26 LAB — CBC
HCT: 27.7 % — ABNORMAL LOW (ref 36.0–46.0)
Hemoglobin: 9.1 g/dL — ABNORMAL LOW (ref 12.0–15.0)
MCH: 28 pg (ref 26.0–34.0)
MCHC: 32.9 g/dL (ref 30.0–36.0)
MCV: 85.2 fL (ref 80.0–100.0)
Platelets: 326 10*3/uL (ref 150–400)
RBC: 3.25 MIL/uL — ABNORMAL LOW (ref 3.87–5.11)
RDW: 15.4 % (ref 11.5–15.5)
WBC: 12.5 10*3/uL — ABNORMAL HIGH (ref 4.0–10.5)
nRBC: 0 % (ref 0.0–0.2)

## 2023-04-26 LAB — BPAM RBC
Blood Product Expiration Date: 202501072359
ISSUE DATE / TIME: 202412171153
Unit Type and Rh: 5100

## 2023-04-26 LAB — GLUCOSE, CAPILLARY: Glucose-Capillary: 117 mg/dL — ABNORMAL HIGH (ref 70–99)

## 2023-04-26 NOTE — Plan of Care (Signed)
Pt and daughter given D/C instructions with verbal understanding. Rx's were given to the Pt at D/C from [facility] Pt's incision is clean and dry with no sign of infection. Pt's IV was removed prior to D/C. Home Health was arranged by CM prior to D/C. Pt refused 3-n-1. Pt D/C'd home via wheelchair per MD order. Pt is stable @ D/C and has no other needs at this time. Rema Fendt, RN

## 2023-04-26 NOTE — Progress Notes (Signed)
Physical Therapy Treatment Patient Details Name: Kaitlyn Allen MRN: [id number] DOB: Sep 10, 1945 Today's Date: 04/26/2023   History of Present Illness 77 yo female s/p PLIF L2-5 on 12/16. PRBC transfusion 12/17. PMH includes thyroid cancer, DM, HLD, HTN, NSTEMI, osteopenia, scleroderma, scoliosis.    PT Comments  Continuing work on functional mobility and activity tolerance;  Session focused on progressive amb, stair training, and reinforcement of precautions in prep for DC home; Daughter, Joni Reining, present for session and helpful; performed stair negotiation backwards well with Nicole's assist; incr time to address questions and concerns; Rec HHPT follow up to help with transition home and progression at home; Encouraged Joni Reining to ask questions about longer term recovery with Dr. Lovell Sheehan at follow up visits; OK for dc home from PT standpoint     If plan is discharge home, recommend the following: A little help with bathing/dressing/bathroom;A little help with walking and/or transfers   Can travel by private vehicle        Equipment Recommendations  Rolling walker (2 wheels);BSC/3in1 (may already have BSC)    Recommendations for Other Services       Precautions / Restrictions Precautions Precautions: Fall;Back Precaution Comments: reviewed BLT rules Required Braces or Orthoses: Spinal Brace Spinal Brace: Lumbar corset;Applied in sitting position Restrictions Weight Bearing Restrictions Per Provider Order: No     Mobility  Bed Mobility Overal bed mobility: Needs Assistance Bed Mobility: Rolling, Sidelying to Sit, Sit to Sidelying Rolling: Supervision Sidelying to sit: Supervision, Used rails     Sit to sidelying: Contact guard assist General bed mobility comments: Tactile cues for form while performing logroll technique    Transfers Overall transfer level: Needs assistance Equipment used: Rolling walker (2 wheels) Transfers: Sit to/from Stand Sit to Stand: Min assist,  Contact guard assist           General transfer comment: from lower surfaces pt will pull onot walker but from chair was able to complete with CGA    Ambulation/Gait Ambulation/Gait assistance: Supervision Gait Distance (Feet): 150 Feet Assistive device: Rolling walker (2 wheels) Gait Pattern/deviations: Step-through pattern, Decreased stride length, Trunk flexed Gait velocity: decr     General Gait Details: cues for upright posture, placement in RW; safety cues to keep RW close, and to refrain from stteing RW to the side and furniture walk   Stairs Stairs: Yes Stairs assistance: Min assist Stair Management: With walker, Backwards, Step to pattern Number of Stairs: 4 General stair comments: Incr time to discuss and demonstrate different technqiues; Daughter, Joni Reining present and giving helpful assist; Pt nervous, but performed backwards technqiue well with daughter assist   Wheelchair Mobility     Tilt Bed    Modified Rankin (Stroke Patients Only)       Balance     Sitting balance-Leahy Scale: Good       Standing balance-Leahy Scale: Fair                              Cognition Arousal: Alert Behavior During Therapy: WFL for tasks assessed/performed, Anxious Overall Cognitive Status: Within Functional Limits for tasks assessed                                          Exercises      General Comments General comments (skin integrity, edema, etc.): Discussed car transfers and early expectations at  home post surgery; recommending HHPT to help with transition home and to help with progression at home      Pertinent Vitals/Pain Pain Assessment Pain Assessment: 0-10 Pain Score: 4  Pain Location: back Pain Descriptors / Indicators: Tender Pain Intervention(s): Monitored during session    Home Living Family/patient expects to be discharged to:: Private residence Living Arrangements: Alone Available Help at Discharge: Family  (daughter will be staying with them for a few days) Type of Home: House Home Access: Stairs to enter Entrance Stairs-Rails: None Entrance Stairs-Number of Steps: 3   Home Layout: One level Home Equipment: Agricultural consultant (2 wheels);Other (comment) (rail along the side of the toilet)      Prior Function            PT Goals (current goals can now be found in the care plan section) Acute Rehab PT Goals Patient Stated Goal: home PT Goal Formulation: With patient Time For Goal Achievement: 05/09/23 Potential to Achieve Goals: Good Progress towards PT goals: Progressing toward goals    Frequency    Min 5X/week      PT Plan      Co-evaluation              AM-PAC PT "6 Clicks" Mobility   Outcome Measure  Help needed turning from your back to your side while in a flat bed without using bedrails?: A Little Help needed moving from lying on your back to sitting on the side of a flat bed without using bedrails?: A Little Help needed moving to and from a bed to a chair (including a wheelchair)?: A Little Help needed standing up from a chair using your arms (e.g., wheelchair or bedside chair)?: A Little Help needed to walk in hospital room?: A Little Help needed climbing 3-5 steps with a railing? : A Little 6 Click Score: 18    End of Session Equipment Utilized During Treatment: Back brace Activity Tolerance: Patient tolerated treatment well Patient left: in chair;with call bell/phone within reach;with family/visitor present Nurse Communication: Mobility status PT Visit Diagnosis: Other abnormalities of gait and mobility (R26.89);Muscle weakness (generalized) (M62.81)     Time: 6045-4098 PT Time Calculation (min) (ACUTE ONLY): 62 min  Charges:    $Gait Training: 23-37 mins $Therapeutic Activity: 8-22 mins $Self Care/Home Management: 8-22 PT General Charges $$ ACUTE PT VISIT: 1 Visit                     Van Clines, PT  Acute Rehabilitation Services Office  [phone] Secure Chat welcomed    Levi Aland 04/26/2023, 10:41 AM

## 2023-04-26 NOTE — Evaluation (Signed)
Occupational Therapy Evaluation Patient Details Name: Kaitlyn Allen MRN: [id number] DOB: [birth year] Today's Date: 04/26/2023   History of Present Illness 77 yo female s/p PLIF L2-5 on 12/16. PRBC transfusion 12/17. PMH includes thyroid cancer, DM, HLD, HTN, NSTEMI, osteopenia, scleroderma, scoliosis.   Clinical Impression   Pt reported at PLOF prior to increase in pain was going to silver sneakers and was part of a walking club. She was able to complete UE/LE ADLS with supervision but depending on surface she was transferring from needed CGA to min assist to stabilize walker. Pt was educated about positioning to increase in ability to complete transfers in session.  Mrs. Kaitlyn Allen reported she does not have enough space in her shower for a shower seat and reported they will complete sponge bath with the return to home sitting to standing and they have AE from a friend they will borrow if needed. She was also advised about a bed rail as noted to dependent on at this time for bed mobility.       If plan is discharge home, recommend the following: A little help with walking and/or transfers;Assistance with cooking/housework;Assist for transportation    Functional Status Assessment  Patient has had a recent decline in their functional status and demonstrates the ability to make significant improvements in function in a reasonable and predictable amount of time.  Equipment Recommendations  None recommended by OT    Recommendations for Other Services       Precautions / Restrictions Precautions Precautions: Fall;Back Precaution Comments: reviewed BLT rules Required Braces or Orthoses: Spinal Brace Spinal Brace: Lumbar corset;Applied in sitting position Restrictions Weight Bearing Restrictions Per Provider Order: No      Mobility Bed Mobility Overal bed mobility: Needs Assistance Bed Mobility: Rolling, Supine to Sit Rolling: Supervision Sidelying to sit: Supervision, Used  rails Supine to sit: Supervision, Used rails     General bed mobility comments: Pt cued about log rolling and did use rail to exit bed    Transfers Overall transfer level: Needs assistance Equipment used: Rolling walker (2 wheels) Transfers: Sit to/from Stand Sit to Stand: Min assist, Contact guard assist           General transfer comment: from lower surfaces pt will pull onot walker but from chair was able to complete with CGA      Balance Overall balance assessment: Needs assistance Sitting-balance support: No upper extremity supported, Feet supported Sitting balance-Leahy Scale: Good     Standing balance support: Bilateral upper extremity supported, Single extremity supported, No upper extremity supported Standing balance-Leahy Scale: Fair                             ADL either performed or assessed with clinical judgement   ADL Overall ADL's : Needs assistance/impaired Eating/Feeding: Modified independent;Sitting   Grooming: Wash/dry hands;Supervision/safety;Standing   Upper Body Bathing: Supervision/ safety;Sitting   Lower Body Bathing: Supervison/ safety;Sit to/from stand   Upper Body Dressing : Supervision/safety;Sitting   Lower Body Dressing: Supervision/safety;Sit to/from stand   Toilet Transfer: Supervision/safety;Cueing for safety;Regular Toilet;Rolling walker (2 wheels);Grab bars   Toileting- Clothing Manipulation and Hygiene: Supervision/safety;Sit to/from stand   Tub/ Shower Transfer: Contact guard assist;Cueing for safety;Cueing for sequencing;Rolling walker (2 wheels)   Functional mobility during ADLs: Supervision/safety;Contact guard assist;Cueing for safety;Cueing for sequencing;Rolling walker (2 wheels)       Vision Baseline Vision/History: 1 Wears glasses Ability to See in Adequate Light: 0 Adequate Patient Visual Report:  No change from baseline Vision Assessment?: No apparent visual deficits     Perception Perception:  Within Functional Limits       Praxis         Pertinent Vitals/Pain Pain Assessment Pain Assessment: Faces Faces Pain Scale: Hurts little more Pain Location: back Pain Descriptors / Indicators: Tender Pain Intervention(s): Limited activity within patient's tolerance, Monitored during session, Repositioned     Extremity/Trunk Assessment Upper Extremity Assessment Upper Extremity Assessment:  (decrease in FM coordination but not acute and has decrease in extension in B hands)   Lower Extremity Assessment Lower Extremity Assessment: Defer to PT evaluation   Cervical / Trunk Assessment Cervical / Trunk Assessment: Back Surgery   Communication Communication Communication: No apparent difficulties Cueing Techniques: Verbal cues   Cognition Arousal: Alert Behavior During Therapy: Anxious Overall Cognitive Status: Within Functional Limits for tasks assessed                                       General Comments       Exercises     Shoulder Instructions      Home Living Family/patient expects to be discharged to:: Private residence Living Arrangements: Alone Available Help at Discharge: Family (daughter will be staying with them for a few days) Type of Home: House Home Access: Stairs to enter [employer] of Steps: 3 Entrance Stairs-Rails: None Home Layout: One level     Bathroom Shower/Tub: Walk-in shower ("shower stall"  and reports no room for a chair but plans to do sponge baths with the return to home)   Bathroom Toilet: Standard     Home Equipment: Agricultural consultant (2 wheels);Other (comment) (rail along the side of the toilet)          Prior Functioning/Environment Prior Level of Function : Independent/Modified Independent             Mobility Comments: Prior to the change in pain they were going to silver sneakers and part of a walking group          OT Problem List: Decreased strength;Decreased activity  tolerance;Impaired balance (sitting and/or standing);Decreased safety awareness;Decreased knowledge of use of DME or AE;Cardiopulmonary status limiting activity;Pain      OT Treatment/Interventions: Self-care/ADL training;DME and/or AE instruction;Therapeutic activities;Patient/family education;Balance training    OT Goals(Current goals can be found in the care plan section) Acute Rehab OT Goals Patient Stated Goal: to go home and rest OT Goal Formulation: With patient Time For Goal Achievement: 05/10/23 Potential to Achieve Goals: Good ADL Goals Pt Will Perform Lower Body Bathing: Independently;sit to/from stand Pt Will Perform Lower Body Dressing: Independently;sit to/from stand Pt Will Transfer to Toilet: with modified independence;ambulating Pt Will Perform Tub/Shower Transfer: Shower transfer;with modified independence;ambulating Additional ADL Goal #1: Pt will be able to complete donning and doffing brace without no cues  OT Frequency: Min 1X/week    Co-evaluation              AM-PAC OT "6 Clicks" Daily Activity     Outcome Measure Help from another person eating meals?: None Help from another person taking care of personal grooming?: A Little Help from another person toileting, which includes using toliet, bedpan, or urinal?: A Little Help from another person bathing (including washing, rinsing, drying)?: A Little Help from another person to put on and taking off regular upper body clothing?: A Little Help from another person to put  on and taking off regular lower body clothing?: A Little 6 Click Score: 19   End of Session Equipment Utilized During Treatment: Gait belt;Rolling walker (2 wheels) Nurse Communication: Mobility status  Activity Tolerance: Patient tolerated treatment well Patient left: in chair;with call bell/phone within reach  OT Visit Diagnosis: Unsteadiness on feet (R26.81);Other abnormalities of gait and mobility (R26.89);Repeated falls (R29.6);Muscle  weakness (generalized) (M62.81);History of falling (Z91.81);Pain Pain - part of body:  (back)                Time: 0630-1601 OT Time Calculation (min): 40 min Charges:  OT General Charges $OT Visit: 1 Visit OT Evaluation $OT Eval Low Complexity: 1 Low OT Treatments $Self Care/Home Management : 23-37 mins  Presley Raddle OTR/L  Acute Rehab Services  [phone] office number   Alphia Moh 04/26/2023, 8:26 AM

## 2023-04-26 NOTE — TOC Transition Note (Signed)
Transition of Care [facility]) - Discharge Note   Patient Details  Name: Kaitlyn Allen MRN: [id number] Date of Birth: 22-Sep-1945  [facility] CM/SW Contact:  Kermit Balo, RN Phone Number: 04/26/2023, 11:05 AM   Clinical Narrative:      Pt is discharging home with home health services through [facility]. Information on the AVS. [facility] will contact her for the first home visit. Pt has transportation home.       Patient Goals and CMS Choice            Discharge Placement                       Discharge Plan and Services Additional resources added to the After Visit Summary for                                       Social Drivers of Health (SDOH) Interventions SDOH Screenings   Tobacco Use: Low Risk  (04/24/2023)     Readmission Risk Interventions     No data to display

## 2023-04-27 MED FILL — Heparin Sodium (Porcine) Inj 1000 Unit/ML: INTRAMUSCULAR | Qty: 30 | Status: AC

## 2023-04-27 MED FILL — Sodium Chloride IV Soln 0.9%: INTRAVENOUS | Qty: 1000 | Status: AC

## 2023-05-02 DIAGNOSIS — M48061 Spinal stenosis, lumbar region without neurogenic claudication: Secondary | ICD-10-CM | POA: Diagnosis not present

## 2023-05-02 DIAGNOSIS — Z4789 Encounter for other orthopedic aftercare: Secondary | ICD-10-CM | POA: Diagnosis not present

## 2023-05-02 DIAGNOSIS — I5181 Takotsubo syndrome: Secondary | ICD-10-CM | POA: Diagnosis not present

## 2023-05-02 DIAGNOSIS — Z981 Arthrodesis status: Secondary | ICD-10-CM | POA: Diagnosis not present

## 2023-05-02 DIAGNOSIS — I252 Old myocardial infarction: Secondary | ICD-10-CM | POA: Diagnosis not present

## 2023-05-02 DIAGNOSIS — D62 Acute posthemorrhagic anemia: Secondary | ICD-10-CM | POA: Diagnosis not present

## 2023-05-02 DIAGNOSIS — E049 Nontoxic goiter, unspecified: Secondary | ICD-10-CM | POA: Diagnosis not present

## 2023-05-02 DIAGNOSIS — M8580 Other specified disorders of bone density and structure, unspecified site: Secondary | ICD-10-CM | POA: Diagnosis not present

## 2023-05-02 DIAGNOSIS — I10 Essential (primary) hypertension: Secondary | ICD-10-CM | POA: Diagnosis not present

## 2023-05-08 DIAGNOSIS — D62 Acute posthemorrhagic anemia: Secondary | ICD-10-CM | POA: Diagnosis not present

## 2023-05-08 DIAGNOSIS — I10 Essential (primary) hypertension: Secondary | ICD-10-CM | POA: Diagnosis not present

## 2023-05-08 DIAGNOSIS — E049 Nontoxic goiter, unspecified: Secondary | ICD-10-CM | POA: Diagnosis not present

## 2023-05-08 DIAGNOSIS — I252 Old myocardial infarction: Secondary | ICD-10-CM | POA: Diagnosis not present

## 2023-05-08 DIAGNOSIS — Z981 Arthrodesis status: Secondary | ICD-10-CM | POA: Diagnosis not present

## 2023-05-08 DIAGNOSIS — I5181 Takotsubo syndrome: Secondary | ICD-10-CM | POA: Diagnosis not present

## 2023-05-08 DIAGNOSIS — Z4789 Encounter for other orthopedic aftercare: Secondary | ICD-10-CM | POA: Diagnosis not present

## 2023-05-08 DIAGNOSIS — M8580 Other specified disorders of bone density and structure, unspecified site: Secondary | ICD-10-CM | POA: Diagnosis not present

## 2023-05-08 DIAGNOSIS — M48061 Spinal stenosis, lumbar region without neurogenic claudication: Secondary | ICD-10-CM | POA: Diagnosis not present

## 2023-05-11 DIAGNOSIS — Z981 Arthrodesis status: Secondary | ICD-10-CM | POA: Diagnosis not present

## 2023-05-11 DIAGNOSIS — D62 Acute posthemorrhagic anemia: Secondary | ICD-10-CM | POA: Diagnosis not present

## 2023-05-11 DIAGNOSIS — M48061 Spinal stenosis, lumbar region without neurogenic claudication: Secondary | ICD-10-CM | POA: Diagnosis not present

## 2023-05-11 DIAGNOSIS — M8580 Other specified disorders of bone density and structure, unspecified site: Secondary | ICD-10-CM | POA: Diagnosis not present

## 2023-05-11 DIAGNOSIS — I10 Essential (primary) hypertension: Secondary | ICD-10-CM | POA: Diagnosis not present

## 2023-05-11 DIAGNOSIS — Z4789 Encounter for other orthopedic aftercare: Secondary | ICD-10-CM | POA: Diagnosis not present

## 2023-05-11 DIAGNOSIS — E049 Nontoxic goiter, unspecified: Secondary | ICD-10-CM | POA: Diagnosis not present

## 2023-05-11 DIAGNOSIS — I5181 Takotsubo syndrome: Secondary | ICD-10-CM | POA: Diagnosis not present

## 2023-05-11 DIAGNOSIS — I252 Old myocardial infarction: Secondary | ICD-10-CM | POA: Diagnosis not present

## 2023-05-12 DIAGNOSIS — Z4789 Encounter for other orthopedic aftercare: Secondary | ICD-10-CM | POA: Diagnosis not present

## 2023-05-12 DIAGNOSIS — I252 Old myocardial infarction: Secondary | ICD-10-CM | POA: Diagnosis not present

## 2023-05-12 DIAGNOSIS — I5181 Takotsubo syndrome: Secondary | ICD-10-CM | POA: Diagnosis not present

## 2023-05-12 DIAGNOSIS — M48061 Spinal stenosis, lumbar region without neurogenic claudication: Secondary | ICD-10-CM | POA: Diagnosis not present

## 2023-05-12 DIAGNOSIS — M8580 Other specified disorders of bone density and structure, unspecified site: Secondary | ICD-10-CM | POA: Diagnosis not present

## 2023-05-12 DIAGNOSIS — I10 Essential (primary) hypertension: Secondary | ICD-10-CM | POA: Diagnosis not present

## 2023-05-12 DIAGNOSIS — Z981 Arthrodesis status: Secondary | ICD-10-CM | POA: Diagnosis not present

## 2023-05-12 DIAGNOSIS — E049 Nontoxic goiter, unspecified: Secondary | ICD-10-CM | POA: Diagnosis not present

## 2023-05-12 DIAGNOSIS — D62 Acute posthemorrhagic anemia: Secondary | ICD-10-CM | POA: Diagnosis not present

## 2023-05-16 DIAGNOSIS — D62 Acute posthemorrhagic anemia: Secondary | ICD-10-CM | POA: Diagnosis not present

## 2023-05-16 DIAGNOSIS — I5181 Takotsubo syndrome: Secondary | ICD-10-CM | POA: Diagnosis not present

## 2023-05-16 DIAGNOSIS — Z4789 Encounter for other orthopedic aftercare: Secondary | ICD-10-CM | POA: Diagnosis not present

## 2023-05-16 DIAGNOSIS — M8580 Other specified disorders of bone density and structure, unspecified site: Secondary | ICD-10-CM | POA: Diagnosis not present

## 2023-05-16 DIAGNOSIS — I252 Old myocardial infarction: Secondary | ICD-10-CM | POA: Diagnosis not present

## 2023-05-16 DIAGNOSIS — M48061 Spinal stenosis, lumbar region without neurogenic claudication: Secondary | ICD-10-CM | POA: Diagnosis not present

## 2023-05-16 DIAGNOSIS — Z981 Arthrodesis status: Secondary | ICD-10-CM | POA: Diagnosis not present

## 2023-05-16 DIAGNOSIS — E049 Nontoxic goiter, unspecified: Secondary | ICD-10-CM | POA: Diagnosis not present

## 2023-05-16 DIAGNOSIS — I10 Essential (primary) hypertension: Secondary | ICD-10-CM | POA: Diagnosis not present

## 2023-05-19 DIAGNOSIS — M4316 Spondylolisthesis, lumbar region: Secondary | ICD-10-CM | POA: Diagnosis not present

## 2023-05-19 DIAGNOSIS — M47816 Spondylosis without myelopathy or radiculopathy, lumbar region: Secondary | ICD-10-CM | POA: Diagnosis not present

## 2023-05-23 DIAGNOSIS — I5181 Takotsubo syndrome: Secondary | ICD-10-CM | POA: Diagnosis not present

## 2023-05-23 DIAGNOSIS — E049 Nontoxic goiter, unspecified: Secondary | ICD-10-CM | POA: Diagnosis not present

## 2023-05-23 DIAGNOSIS — I10 Essential (primary) hypertension: Secondary | ICD-10-CM | POA: Diagnosis not present

## 2023-05-23 DIAGNOSIS — M8580 Other specified disorders of bone density and structure, unspecified site: Secondary | ICD-10-CM | POA: Diagnosis not present

## 2023-05-23 DIAGNOSIS — D62 Acute posthemorrhagic anemia: Secondary | ICD-10-CM | POA: Diagnosis not present

## 2023-05-23 DIAGNOSIS — M48061 Spinal stenosis, lumbar region without neurogenic claudication: Secondary | ICD-10-CM | POA: Diagnosis not present

## 2023-05-23 DIAGNOSIS — Z981 Arthrodesis status: Secondary | ICD-10-CM | POA: Diagnosis not present

## 2023-05-23 DIAGNOSIS — I252 Old myocardial infarction: Secondary | ICD-10-CM | POA: Diagnosis not present

## 2023-05-23 DIAGNOSIS — Z4789 Encounter for other orthopedic aftercare: Secondary | ICD-10-CM | POA: Diagnosis not present

## 2023-05-31 DIAGNOSIS — D62 Acute posthemorrhagic anemia: Secondary | ICD-10-CM | POA: Diagnosis not present

## 2023-05-31 DIAGNOSIS — M8580 Other specified disorders of bone density and structure, unspecified site: Secondary | ICD-10-CM | POA: Diagnosis not present

## 2023-05-31 DIAGNOSIS — Z4789 Encounter for other orthopedic aftercare: Secondary | ICD-10-CM | POA: Diagnosis not present

## 2023-05-31 DIAGNOSIS — Z981 Arthrodesis status: Secondary | ICD-10-CM | POA: Diagnosis not present

## 2023-05-31 DIAGNOSIS — M48061 Spinal stenosis, lumbar region without neurogenic claudication: Secondary | ICD-10-CM | POA: Diagnosis not present

## 2023-05-31 DIAGNOSIS — E049 Nontoxic goiter, unspecified: Secondary | ICD-10-CM | POA: Diagnosis not present

## 2023-05-31 DIAGNOSIS — I252 Old myocardial infarction: Secondary | ICD-10-CM | POA: Diagnosis not present

## 2023-05-31 DIAGNOSIS — I10 Essential (primary) hypertension: Secondary | ICD-10-CM | POA: Diagnosis not present

## 2023-05-31 DIAGNOSIS — I5181 Takotsubo syndrome: Secondary | ICD-10-CM | POA: Diagnosis not present

## 2023-06-01 DIAGNOSIS — M8580 Other specified disorders of bone density and structure, unspecified site: Secondary | ICD-10-CM | POA: Diagnosis not present

## 2023-06-01 DIAGNOSIS — I10 Essential (primary) hypertension: Secondary | ICD-10-CM | POA: Diagnosis not present

## 2023-06-01 DIAGNOSIS — Z981 Arthrodesis status: Secondary | ICD-10-CM | POA: Diagnosis not present

## 2023-06-01 DIAGNOSIS — I5181 Takotsubo syndrome: Secondary | ICD-10-CM | POA: Diagnosis not present

## 2023-06-01 DIAGNOSIS — E049 Nontoxic goiter, unspecified: Secondary | ICD-10-CM | POA: Diagnosis not present

## 2023-06-01 DIAGNOSIS — Z4789 Encounter for other orthopedic aftercare: Secondary | ICD-10-CM | POA: Diagnosis not present

## 2023-06-01 DIAGNOSIS — D62 Acute posthemorrhagic anemia: Secondary | ICD-10-CM | POA: Diagnosis not present

## 2023-06-01 DIAGNOSIS — M48061 Spinal stenosis, lumbar region without neurogenic claudication: Secondary | ICD-10-CM | POA: Diagnosis not present

## 2023-06-01 DIAGNOSIS — I252 Old myocardial infarction: Secondary | ICD-10-CM | POA: Diagnosis not present

## 2023-06-05 DIAGNOSIS — I252 Old myocardial infarction: Secondary | ICD-10-CM | POA: Diagnosis not present

## 2023-06-05 DIAGNOSIS — Z4789 Encounter for other orthopedic aftercare: Secondary | ICD-10-CM | POA: Diagnosis not present

## 2023-06-05 DIAGNOSIS — Z981 Arthrodesis status: Secondary | ICD-10-CM | POA: Diagnosis not present

## 2023-06-05 DIAGNOSIS — I5181 Takotsubo syndrome: Secondary | ICD-10-CM | POA: Diagnosis not present

## 2023-06-05 DIAGNOSIS — I10 Essential (primary) hypertension: Secondary | ICD-10-CM | POA: Diagnosis not present

## 2023-06-05 DIAGNOSIS — E049 Nontoxic goiter, unspecified: Secondary | ICD-10-CM | POA: Diagnosis not present

## 2023-06-05 DIAGNOSIS — D62 Acute posthemorrhagic anemia: Secondary | ICD-10-CM | POA: Diagnosis not present

## 2023-06-05 DIAGNOSIS — M48061 Spinal stenosis, lumbar region without neurogenic claudication: Secondary | ICD-10-CM | POA: Diagnosis not present

## 2023-06-05 DIAGNOSIS — M8580 Other specified disorders of bone density and structure, unspecified site: Secondary | ICD-10-CM | POA: Diagnosis not present

## 2023-06-13 DIAGNOSIS — M48061 Spinal stenosis, lumbar region without neurogenic claudication: Secondary | ICD-10-CM | POA: Diagnosis not present

## 2023-06-13 DIAGNOSIS — E049 Nontoxic goiter, unspecified: Secondary | ICD-10-CM | POA: Diagnosis not present

## 2023-06-13 DIAGNOSIS — I252 Old myocardial infarction: Secondary | ICD-10-CM | POA: Diagnosis not present

## 2023-06-13 DIAGNOSIS — I10 Essential (primary) hypertension: Secondary | ICD-10-CM | POA: Diagnosis not present

## 2023-06-13 DIAGNOSIS — Z981 Arthrodesis status: Secondary | ICD-10-CM | POA: Diagnosis not present

## 2023-06-13 DIAGNOSIS — D62 Acute posthemorrhagic anemia: Secondary | ICD-10-CM | POA: Diagnosis not present

## 2023-06-13 DIAGNOSIS — I5181 Takotsubo syndrome: Secondary | ICD-10-CM | POA: Diagnosis not present

## 2023-06-13 DIAGNOSIS — M8580 Other specified disorders of bone density and structure, unspecified site: Secondary | ICD-10-CM | POA: Diagnosis not present

## 2023-06-13 DIAGNOSIS — Z4789 Encounter for other orthopedic aftercare: Secondary | ICD-10-CM | POA: Diagnosis not present

## 2023-06-15 DIAGNOSIS — M5459 Other low back pain: Secondary | ICD-10-CM | POA: Diagnosis not present

## 2023-06-15 DIAGNOSIS — R262 Difficulty in walking, not elsewhere classified: Secondary | ICD-10-CM | POA: Diagnosis not present

## 2023-06-27 DIAGNOSIS — R262 Difficulty in walking, not elsewhere classified: Secondary | ICD-10-CM | POA: Diagnosis not present

## 2023-06-27 DIAGNOSIS — M5459 Other low back pain: Secondary | ICD-10-CM | POA: Diagnosis not present

## 2023-06-30 DIAGNOSIS — R262 Difficulty in walking, not elsewhere classified: Secondary | ICD-10-CM | POA: Diagnosis not present

## 2023-06-30 DIAGNOSIS — M5459 Other low back pain: Secondary | ICD-10-CM | POA: Diagnosis not present

## 2023-07-04 DIAGNOSIS — R262 Difficulty in walking, not elsewhere classified: Secondary | ICD-10-CM | POA: Diagnosis not present

## 2023-07-04 DIAGNOSIS — M5459 Other low back pain: Secondary | ICD-10-CM | POA: Diagnosis not present

## 2023-07-07 DIAGNOSIS — M5459 Other low back pain: Secondary | ICD-10-CM | POA: Diagnosis not present

## 2023-07-07 DIAGNOSIS — R262 Difficulty in walking, not elsewhere classified: Secondary | ICD-10-CM | POA: Diagnosis not present

## 2023-07-11 DIAGNOSIS — R262 Difficulty in walking, not elsewhere classified: Secondary | ICD-10-CM | POA: Diagnosis not present

## 2023-07-11 DIAGNOSIS — M5459 Other low back pain: Secondary | ICD-10-CM | POA: Diagnosis not present

## 2023-07-12 DIAGNOSIS — L03114 Cellulitis of left upper limb: Secondary | ICD-10-CM | POA: Diagnosis not present

## 2023-07-12 DIAGNOSIS — M340 Progressive systemic sclerosis: Secondary | ICD-10-CM | POA: Diagnosis not present

## 2023-07-12 DIAGNOSIS — I7300 Raynaud's syndrome without gangrene: Secondary | ICD-10-CM | POA: Diagnosis not present

## 2023-07-12 DIAGNOSIS — Z6820 Body mass index (BMI) 20.0-20.9, adult: Secondary | ICD-10-CM | POA: Diagnosis not present

## 2023-07-12 DIAGNOSIS — M353 Polymyalgia rheumatica: Secondary | ICD-10-CM | POA: Diagnosis not present

## 2023-07-12 DIAGNOSIS — M5431 Sciatica, right side: Secondary | ICD-10-CM | POA: Diagnosis not present

## 2023-07-12 DIAGNOSIS — M8580 Other specified disorders of bone density and structure, unspecified site: Secondary | ICD-10-CM | POA: Diagnosis not present

## 2023-07-12 DIAGNOSIS — R918 Other nonspecific abnormal finding of lung field: Secondary | ICD-10-CM | POA: Diagnosis not present

## 2023-07-12 DIAGNOSIS — M5136 Other intervertebral disc degeneration, lumbar region with discogenic back pain only: Secondary | ICD-10-CM | POA: Diagnosis not present

## 2023-07-13 DIAGNOSIS — M5459 Other low back pain: Secondary | ICD-10-CM | POA: Diagnosis not present

## 2023-07-13 DIAGNOSIS — R262 Difficulty in walking, not elsewhere classified: Secondary | ICD-10-CM | POA: Diagnosis not present

## 2023-07-18 DIAGNOSIS — R262 Difficulty in walking, not elsewhere classified: Secondary | ICD-10-CM | POA: Diagnosis not present

## 2023-07-18 DIAGNOSIS — M5459 Other low back pain: Secondary | ICD-10-CM | POA: Diagnosis not present

## 2023-07-19 DIAGNOSIS — H2513 Age-related nuclear cataract, bilateral: Secondary | ICD-10-CM | POA: Diagnosis not present

## 2023-07-19 DIAGNOSIS — E119 Type 2 diabetes mellitus without complications: Secondary | ICD-10-CM | POA: Diagnosis not present

## 2023-07-19 DIAGNOSIS — H16223 Keratoconjunctivitis sicca, not specified as Sjogren's, bilateral: Secondary | ICD-10-CM | POA: Diagnosis not present

## 2023-07-21 DIAGNOSIS — M5459 Other low back pain: Secondary | ICD-10-CM | POA: Diagnosis not present

## 2023-07-21 DIAGNOSIS — R262 Difficulty in walking, not elsewhere classified: Secondary | ICD-10-CM | POA: Diagnosis not present

## 2023-07-24 DIAGNOSIS — M349 Systemic sclerosis, unspecified: Secondary | ICD-10-CM | POA: Diagnosis not present

## 2023-07-24 DIAGNOSIS — I129 Hypertensive chronic kidney disease with stage 1 through stage 4 chronic kidney disease, or unspecified chronic kidney disease: Secondary | ICD-10-CM | POA: Diagnosis not present

## 2023-07-24 DIAGNOSIS — I7300 Raynaud's syndrome without gangrene: Secondary | ICD-10-CM | POA: Diagnosis not present

## 2023-07-24 DIAGNOSIS — E039 Hypothyroidism, unspecified: Secondary | ICD-10-CM | POA: Diagnosis not present

## 2023-07-24 DIAGNOSIS — N1832 Chronic kidney disease, stage 3b: Secondary | ICD-10-CM | POA: Diagnosis not present

## 2023-07-25 ENCOUNTER — Other Ambulatory Visit: Payer: Self-pay | Admitting: Internal Medicine

## 2023-07-25 DIAGNOSIS — N1832 Chronic kidney disease, stage 3b: Secondary | ICD-10-CM

## 2023-07-25 DIAGNOSIS — R262 Difficulty in walking, not elsewhere classified: Secondary | ICD-10-CM | POA: Diagnosis not present

## 2023-07-25 DIAGNOSIS — Z0100 Encounter for examination of eyes and vision without abnormal findings: Secondary | ICD-10-CM | POA: Diagnosis not present

## 2023-07-25 DIAGNOSIS — M5459 Other low back pain: Secondary | ICD-10-CM | POA: Diagnosis not present

## 2023-07-27 ENCOUNTER — Encounter: Payer: Self-pay | Admitting: Internal Medicine

## 2023-07-28 DIAGNOSIS — R262 Difficulty in walking, not elsewhere classified: Secondary | ICD-10-CM | POA: Diagnosis not present

## 2023-07-28 DIAGNOSIS — M5459 Other low back pain: Secondary | ICD-10-CM | POA: Diagnosis not present

## 2023-07-31 ENCOUNTER — Other Ambulatory Visit

## 2023-07-31 ENCOUNTER — Ambulatory Visit
Admission: RE | Admit: 2023-07-31 | Discharge: 2023-07-31 | Disposition: A | Discharge status: Home / Self Care | Source: Ambulatory Visit | Attending: Internal Medicine | Admitting: Internal Medicine

## 2023-07-31 DIAGNOSIS — R93422 Abnormal radiologic findings on diagnostic imaging of left kidney: Secondary | ICD-10-CM | POA: Diagnosis not present

## 2023-07-31 DIAGNOSIS — N1832 Chronic kidney disease, stage 3b: Secondary | ICD-10-CM

## 2023-08-01 DIAGNOSIS — M5459 Other low back pain: Secondary | ICD-10-CM | POA: Diagnosis not present

## 2023-08-01 DIAGNOSIS — R262 Difficulty in walking, not elsewhere classified: Secondary | ICD-10-CM | POA: Diagnosis not present

## 2023-08-04 ENCOUNTER — Encounter: Payer: Self-pay | Admitting: Internal Medicine

## 2023-08-04 DIAGNOSIS — R262 Difficulty in walking, not elsewhere classified: Secondary | ICD-10-CM | POA: Diagnosis not present

## 2023-08-04 DIAGNOSIS — M5459 Other low back pain: Secondary | ICD-10-CM | POA: Diagnosis not present

## 2023-08-07 ENCOUNTER — Other Ambulatory Visit: Payer: Self-pay | Admitting: Internal Medicine

## 2023-08-07 DIAGNOSIS — N2889 Other specified disorders of kidney and ureter: Secondary | ICD-10-CM

## 2023-08-09 ENCOUNTER — Encounter: Payer: Self-pay | Admitting: Internal Medicine

## 2023-08-10 ENCOUNTER — Encounter: Payer: Self-pay | Admitting: Internal Medicine

## 2023-08-11 ENCOUNTER — Ambulatory Visit
Admission: RE | Admit: 2023-08-11 | Discharge: 2023-08-11 | Disposition: A | Discharge status: Home / Self Care | Source: Ambulatory Visit | Attending: Internal Medicine | Admitting: Internal Medicine

## 2023-08-11 DIAGNOSIS — C642 Malignant neoplasm of left kidney, except renal pelvis: Secondary | ICD-10-CM | POA: Diagnosis not present

## 2023-08-11 DIAGNOSIS — N2889 Other specified disorders of kidney and ureter: Secondary | ICD-10-CM

## 2023-08-11 MED ORDER — IOPAMIDOL (ISOVUE-370) INJECTION 76%
500.0000 mL | Freq: Once | INTRAVENOUS | Status: AC | PRN
Start: 2023-08-11 — End: 2023-08-11
  Administered 2023-08-11: 80 mL via INTRAVENOUS

## 2023-08-17 DIAGNOSIS — D49512 Neoplasm of unspecified behavior of left kidney: Secondary | ICD-10-CM | POA: Diagnosis not present

## 2023-08-18 DIAGNOSIS — N183 Chronic kidney disease, stage 3 unspecified: Secondary | ICD-10-CM | POA: Diagnosis not present

## 2023-08-18 DIAGNOSIS — I129 Hypertensive chronic kidney disease with stage 1 through stage 4 chronic kidney disease, or unspecified chronic kidney disease: Secondary | ICD-10-CM | POA: Diagnosis not present

## 2023-08-18 DIAGNOSIS — C642 Malignant neoplasm of left kidney, except renal pelvis: Secondary | ICD-10-CM | POA: Diagnosis not present

## 2023-08-18 DIAGNOSIS — E785 Hyperlipidemia, unspecified: Secondary | ICD-10-CM | POA: Diagnosis not present

## 2023-08-22 DIAGNOSIS — M4316 Spondylolisthesis, lumbar region: Secondary | ICD-10-CM | POA: Diagnosis not present

## 2023-08-23 ENCOUNTER — Ambulatory Visit (HOSPITAL_COMMUNITY)
Admission: RE | Admit: 2023-08-23 | Discharge: 2023-08-23 | Disposition: A | Discharge status: Home / Self Care | Source: Ambulatory Visit | Attending: Urology | Admitting: Urology

## 2023-08-23 ENCOUNTER — Other Ambulatory Visit (HOSPITAL_COMMUNITY): Payer: Self-pay | Admitting: Urology

## 2023-08-23 DIAGNOSIS — C642 Malignant neoplasm of left kidney, except renal pelvis: Secondary | ICD-10-CM | POA: Diagnosis not present

## 2023-08-23 DIAGNOSIS — D49512 Neoplasm of unspecified behavior of left kidney: Secondary | ICD-10-CM | POA: Insufficient documentation

## 2023-09-01 ENCOUNTER — Other Ambulatory Visit: Payer: Self-pay | Admitting: Urology

## 2023-09-07 ENCOUNTER — Other Ambulatory Visit (HOSPITAL_COMMUNITY): Payer: Self-pay | Admitting: *Deleted

## 2023-09-07 DIAGNOSIS — I5181 Takotsubo syndrome: Secondary | ICD-10-CM

## 2023-09-07 DIAGNOSIS — M349 Systemic sclerosis, unspecified: Secondary | ICD-10-CM

## 2023-09-07 NOTE — Progress Notes (Signed)
 Pt due for 18 month f/u pfts/echo/appt with [facility], orders placed will arrange

## 2023-09-15 ENCOUNTER — Telehealth (HOSPITAL_COMMUNITY): Payer: Self-pay

## 2023-09-15 NOTE — Telephone Encounter (Signed)
  [facility]   Pre-operative Risk Assessment       Request for Surgical Clearance    Procedure:  NEPHRECTOMY, RADICAL, ROBOT-ASSISTED, LAPAROSCOPIC, ADULT  Date of Surgery:  Clearance 12/04/23                               { Surgeon:  Dr. Sharri Dee Group or Practice Name:  [facility]  Phone number:  [phone] Fax number:  [phone] { Type of Clearance Requested:   - Medical    Type of Anesthesia:  not listed   Additional requests/questions:  Please fax a copy of clearance to the surgeon's office.   Signed, Patryce Depriest B Leiann Sporer   09/15/2023, 10:17 AM   [facility]

## 2023-10-18 ENCOUNTER — Ambulatory Visit (HOSPITAL_COMMUNITY)
Admission: RE | Admit: 2023-10-18 | Discharge: 2023-10-18 | Disposition: A | Discharge status: Home / Self Care | Source: Ambulatory Visit | Attending: Internal Medicine | Admitting: Internal Medicine

## 2023-10-18 DIAGNOSIS — M349 Systemic sclerosis, unspecified: Secondary | ICD-10-CM | POA: Diagnosis not present

## 2023-10-18 DIAGNOSIS — I10 Essential (primary) hypertension: Secondary | ICD-10-CM | POA: Diagnosis not present

## 2023-10-18 DIAGNOSIS — E119 Type 2 diabetes mellitus without complications: Secondary | ICD-10-CM | POA: Diagnosis not present

## 2023-10-18 DIAGNOSIS — I5181 Takotsubo syndrome: Secondary | ICD-10-CM | POA: Diagnosis not present

## 2023-10-18 LAB — ECHOCARDIOGRAM COMPLETE
Area-P 1/2: 3.5 cm2
Calc EF: 55.1 %
S' Lateral: 2.1 cm
Single Plane A2C EF: 58.9 %
Single Plane A4C EF: 48.9 %

## 2023-11-02 ENCOUNTER — Ambulatory Visit (HOSPITAL_COMMUNITY)
Admission: RE | Admit: 2023-11-02 | Discharge: 2023-11-02 | Disposition: A | Discharge status: Home / Self Care | Source: Ambulatory Visit | Attending: Internal Medicine | Admitting: Internal Medicine

## 2023-11-02 ENCOUNTER — Encounter (HOSPITAL_COMMUNITY): Payer: Self-pay | Admitting: Internal Medicine

## 2023-11-02 VITALS — BP 130/80 | HR 78 | Wt 126.2 lb

## 2023-11-02 DIAGNOSIS — M349 Systemic sclerosis, unspecified: Secondary | ICD-10-CM | POA: Diagnosis not present

## 2023-11-02 DIAGNOSIS — I5181 Takotsubo syndrome: Secondary | ICD-10-CM

## 2023-11-02 DIAGNOSIS — E119 Type 2 diabetes mellitus without complications: Secondary | ICD-10-CM | POA: Diagnosis not present

## 2023-11-02 DIAGNOSIS — C649 Malignant neoplasm of unspecified kidney, except renal pelvis: Secondary | ICD-10-CM | POA: Insufficient documentation

## 2023-11-02 DIAGNOSIS — Z79899 Other long term (current) drug therapy: Secondary | ICD-10-CM | POA: Insufficient documentation

## 2023-11-02 DIAGNOSIS — E890 Postprocedural hypothyroidism: Secondary | ICD-10-CM | POA: Insufficient documentation

## 2023-11-02 DIAGNOSIS — Z01810 Encounter for preprocedural cardiovascular examination: Secondary | ICD-10-CM | POA: Diagnosis not present

## 2023-11-02 DIAGNOSIS — I2584 Coronary atherosclerosis due to calcified coronary lesion: Secondary | ICD-10-CM | POA: Insufficient documentation

## 2023-11-02 DIAGNOSIS — I10 Essential (primary) hypertension: Secondary | ICD-10-CM | POA: Diagnosis not present

## 2023-11-02 DIAGNOSIS — R942 Abnormal results of pulmonary function studies: Secondary | ICD-10-CM | POA: Insufficient documentation

## 2023-11-02 DIAGNOSIS — R9431 Abnormal electrocardiogram [ECG] [EKG]: Secondary | ICD-10-CM | POA: Diagnosis not present

## 2023-11-02 DIAGNOSIS — Z79890 Hormone replacement therapy (postmenopausal): Secondary | ICD-10-CM | POA: Diagnosis not present

## 2023-11-02 LAB — PULMONARY FUNCTION TEST
DL/VA % pred: 72 %
DL/VA: 2.93 ml/min/mmHg/L
DLCO unc % pred: 67 %
DLCO unc: 13.29 ml/min/mmHg
FEF 25-75 Pre: 2.71 L/s
FEF2575-%Pred-Pre: 172 %
FEV1-%Pred-Pre: 133 %
FEV1-Pre: 2.8 L
FEV1FVC-%Pred-Pre: 111 %
FEV6-%Pred-Pre: 127 %
FEV6-Pre: 3.39 L
FEV6FVC-%Pred-Pre: 105 %
FVC-%Pred-Pre: 120 %
FVC-Pre: 3.39 L
Pre FEV1/FVC ratio: 83 %
Pre FEV6/FVC Ratio: 100 %
RV % pred: 106 %
RV: 2.56 L
TLC % pred: 115 %
TLC: 6.03 L

## 2023-11-02 NOTE — Patient Instructions (Signed)
 No Labs done today.   No medication changes were made. Please continue all current medications as prescribed.  Your physician recommends that you schedule a follow-up appointment in: 1 year. Please contact our office in April 2026 to schedule a June 2026 appointment.   If you have any questions or concerns before your next appointment please send us  a message through [facility] or call our office at [phone].    TO LEAVE A MESSAGE FOR THE NURSE SELECT OPTION 2, PLEASE LEAVE A MESSAGE INCLUDING: YOUR NAME DATE OF BIRTH CALL BACK NUMBER REASON FOR CALL**this is important as we prioritize the call backs  YOU WILL RECEIVE A CALL BACK THE SAME DAY AS LONG AS YOU CALL BEFORE 4:00 PM   Do the following things EVERYDAY: Weigh yourself in the morning before breakfast. Write it down and keep it in a log. Take your medicines as prescribed Eat low salt foods--Limit salt (sodium) to 2000 mg per day.  Stay as active as you can everyday Limit all fluids for the day to less than 2 liters   At the [facility], you and your health needs are our priority. As part of our continuing mission to provide you with exceptional heart care, we have created designated Provider Care Teams. These Care Teams include your primary Cardiologist (physician) and Advanced Practice Providers (APPs- Physician Assistants and Nurse Practitioners) who all work together to provide you with the care you need, when you need it.   You may see any of the following providers on your designated Care Team at your next follow up: Dr Toribio Fuel Dr Ezra Shuck Dr. Ria Gardenia Greig Lenetta, NP Caffie Shed, [place] [facility] [place], [place] Beckey Coe, NP Tinnie Redman, PharmD   Please be sure to bring in all your medications bottles to every appointment.    Thank you for choosing [facility]

## 2023-11-02 NOTE — Progress Notes (Signed)
 [facility] Note   Referring Physician: Dr. Mai PCP: Teresa Channel, MD  Endo: Dr. Lavon PCP-Cardiologist: Dr. Swaziland  HPI:  Kaitlyn Allen is a 78 y.o. female with scleroderma and h/o Takotsubo cardiomyopathy referred by Dr. Mai for pulmonary hypertension screening.   She has distant history of Takotsubo CMP related to acute distress from a thyroid  surgery, and was followed transiently by Dr. Swaziland. Last seen 2013.  Echo 12/2010 LVEF 25-30%, in setting of recent thyroid  surgery. HK of distal inferoseptal, mid/distal lateral, mid/distal anterior, distal inferior, mid/distal posterior and apical walls.  Echo 03/2011 LVEF 55-60% Echo 10/2012 showed LVEF 40-45%  HiresCT 12/19 No ILD + tracheobronchomalacia + 3v coronary calcium   PFT 09/2014 FVC 3.6 (110%), FEV1 3.44 (116), DLCO 15.52 (60%)  PFTs 02/23/18 shows FVC 3.22 (105), FEV1 2.61 (113%) DLCO 15.30 (59%) Echo 02/23/18 EF 55% with mildly dilated RV.   RHC 11/19 RA = 4 RV = 21/4 PA = 23/7 (15) PCW = 6 Fick cardiac output/index  4.5/2.8 PVR = 2.0 WU Ao sat = 98% PA sat = 67%,68%  ECHO 09/30/20 EF 65-70%, normal RV, no TR, no evidence of PH or RV strain PFT's  09/30/20  FEV1 2.59 (117%) FVC 3.22 (109%) DLCO 71%  Echo 2/24 EF 55-60% ? Mild RV diation. Function normal   PFTs 2/24 FEV1 2.81 (131%) FVC 3.42 (119%) DLCO 68%  Initially established with me 02/2018 for pulmonary hypertension screening in the setting of Scleroderma. Diagnosed at [facility] 25 years ago. Dr Mai follows her for her Scleroderma.  We arranged RHC in 11/19 which demonstrated normal findings overall PA = 23/7 (15)  Here for routine f/u. Had back surgery in 12/24 no problems. Recently diagnosed with RCC and pending nephrectomy in July. Did 2 miles of a 5K recently. Feels good. Walking 2x/week 1.5 miles at a time. No CP or SOB.   Echo 10/18/23 EF 55-60% RV ok G1DD   PFTs FEV1 2.8 FVC 3.39 DLCO 67%   Social: Retired  Manufacturing engineer.     Past Medical History:  Diagnosis Date   Cancer (HCC)    thyroid    Diabetes mellitus    Hyperlipidemia    Hypertension    NSTEMI (non-ST elevated myocardial infarction) (HCC) 12/2010   pulmonary edema & NSTEMI post-thyroidectomy; 12/31/10 LHV: Normal coronaries, moderate LV dysfunction, EF 40%   Osteopenia    Scleroderma (HCC)    Scoliosis    Takotsubo cardiomyopathy 12/2010   post-thyroidectomy   Thyroid  goiter    s/p thyroidectomy    Current Outpatient Medications  Medication Sig Dispense Refill   atorvastatin  (LIPITOR) 40 MG tablet Take 40 mg by mouth daily.     B Complex-C (SUPER B COMPLEX PO) Take 1 tablet by mouth daily.     Calcium -Phosphorus-Vitamin D (CALCIUM  GUMMIES PO) Take 2 each by mouth daily.     Cholecalciferol (VITAMIN D-3) 25 MCG (1000 UT) CAPS Take 1,000 Units by mouth daily.     enalapril  (VASOTEC ) 20 MG tablet Take 20 mg by mouth daily.      hydrochlorothiazide  (MICROZIDE ) 12.5 MG capsule Take 12.5 mg by mouth daily.     levothyroxine  (SYNTHROID ) 100 MCG tablet Take 100 mcg by mouth daily before breakfast.     loratadine  (CLARITIN ) 10 MG tablet Take 10 mg by mouth daily as needed for allergies.     Magnesium 250 MG TABS Take 250 mg by mouth daily.     Multiple Vitamins-Minerals (MULTIVITAMIN GUMMIES ADULT PO) Take 2  each by mouth daily.     Omega-3 Fatty Acids (FISH OIL PO) Take 1,400 mg by mouth daily.     No current facility-administered medications for this encounter.    Allergies  Allergen Reactions   Sulfa Drugs Cross Reactors Hives    Happened in childhood, believes they were all over the body.      Social History   Socioeconomic History   Marital status: Divorced    Spouse name: Not on file   Number of children: Not on file   Years of education: Not on file   Highest education level: Not on file  Occupational History   Not on file  Tobacco Use   Smoking status: Never   Smokeless tobacco: Never  Vaping Use    Vaping status: Never Used  Substance and Sexual Activity   Alcohol use: No   Drug use: No   Sexual activity: Not on file  Other Topics Concern   Not on file  Social History Narrative   Not on file   Social Drivers of Health   Financial Resource Strain: Not on file  Food Insecurity: Not on file  Transportation Needs: Not on file  Physical Activity: Not on file  Stress: Not on file  Social Connections: Not on file  Intimate Partner Violence: Not on file      Family History  Problem Relation Age of Onset   Breast cancer Mother 41   Cancer Mother        breast   Arthritis Sister    Heart failure Father     Vitals:   11/02/23 1028  BP: 130/80  Pulse: 78  SpO2: 97%  Weight: 57.2 kg (126 lb 3.2 oz)    Wt Readings from Last 3 Encounters:  11/02/23 57.2 kg (126 lb 3.2 oz)  04/24/23 56.2 kg (124 lb)  04/18/23 56.3 kg (124 lb 1.6 oz)    PHYSICAL EXAM: General:  Well appearing. No resp difficulty HEENT: normal Neck: supple. no JVD. Carotids 2+ bilat; no bruits. No lymphadenopathy or thryomegaly appreciated. Cor: PMI nondisplaced. Regular rate & rhythm. No rubs, gallops or murmurs. Lungs: clear Abdomen: soft, nontender, nondistended. No hepatosplenomegaly. No bruits or masses. Good bowel sounds. Extremities: no cyanosis, clubbing, rash, edema + scleroderma arthritic changes Neuro: alert & orientedx3, cranial nerves grossly intact. moves all 4 extremities w/o difficulty. Affect pleasant  ECG: NSR 76 bpm No ST-T wave abnormalities. Personally reviewed   ASSESSMENT & PLAN:  1. Scleroderma - 02/2018 PFTs with moderately decreased DLCO which is likely consistent with at least mild pulmonary vascular disease. Otherwise normal. -02/2018 ECHO EF 55% with mildly dilated RV. -03/2018 RHC normal with PA = 23/7 (15) -04/2018 HRCT with no ILD, severe tracheobronchomalacia, moderate air trapping consistent with small airways disease - Echo 5/22 EF 65-70%, and normal RV.  - Echo  06/30/22 EF 55-60% normal RV - PFTs 2/24 DLCO up to 68% - Echo 10/18/23 EF 55-60% RV ok G1DD Personally reviewed - PFTs FEV1 2.8 FVC 3.39 DLCO 67%  - Overall stable. No indication of developing PAH or ILD  2. H/o NICM, suspected Takotsubo - s/p Thyroid  surgery in 2012  - Cath 12/2010 with normal coronaries.  - EF had improved to normal 03/2011, but was again depressed at 40-45% 10/2012.  -02/2018 ECHO EF 55% with mildly dilated RV - EF remains normal on echo Personally reviewed  3. HTN - Blood pressure well controlled. Continue current regimen.  4. 3v coronary calcium  - on statin  -  no s/s angina - goal LDL < 70 - followed by PCP  5. DM2 - With coronary Ca and DM2 can consider Jardiance after nephrectomy   6. RCC - pending nephrectomy - she ius low risk for peri-op CV complications. Can proceed without furtehr testing  Toribio Fuel, MD  11:03 AM

## 2023-11-02 NOTE — Addendum Note (Signed)
 Encounter addended by: Alvan Fausto SAUNDERS, CMA on: 11/02/2023 11:16 AM  Actions taken: Clinical Note Signed

## 2023-11-17 NOTE — Progress Notes (Signed)
 Anesthesia Review:  PCP: Montie Pizza  Cardiologist : Bensimhon LOV 11/02/23   PPM/ ICD: Device Orders: Rep Notified:  Chest x-ray : 08/24/23- 2 view  EKG : 11/02/23  Echo : 10/18/23  PFT- 11/02/23  Stress test: Cardiac Cath :  2019   Activity level:  Sleep Study/ CPAP : Fasting Blood Sugar :      / Checks Blood Sugar -- times a day:    Blood Thinner/ Instructions /Last Dose: ASA / Instructions/ Last Dose :  Takotsubo cardiomyopathy

## 2023-11-17 NOTE — Patient Instructions (Signed)
 SURGICAL WAITING ROOM VISITATION  Patients having surgery or a procedure may have no more than 2 support people in the waiting area - these visitors may rotate.    Children under the age of 40 must have an adult with them who is not the patient.  Visitors with respiratory illnesses are discouraged from visiting and should remain at home.  If the patient needs to stay at the hospital during part of their recovery, the visitor guidelines for inpatient rooms apply. Pre-op nurse will coordinate an appropriate time for 1 support person to accompany patient in pre-op.  This support person may not rotate.    Please refer to the [facility] website for the visitor guidelines for Inpatients (after your surgery is over and you are in a regular room).       Your procedure is scheduled on:  12/04/23    Report to [facility] Main Entrance    Report to admitting at   1000 AM   Call this number if you have problems the morning of surgery [phone]            Clear liquid diet the day before surgery              Magnesium Citrate- 8 ounces at 12 noon day before surgery.             Fleets enema nite before surgery.    After Midnight you may have the following liquids until __ 0900____ AM  DAY OF SURGERY  Water Non-Citrus Juices (without pulp, NO RED-Apple, White grape, White cranberry) Black Coffee (NO MILK/CREAM OR CREAMERS, sugar ok)  Clear Tea (NO MILK/CREAM OR CREAMERS, sugar ok) regular and decaf                             Plain Jell-O (NO RED)                                           Fruit ices (not with fruit pulp, NO RED)                                     Popsicles (NO RED)                                                               Sports drinks like Gatorade (NO RED)                             If you have questions, please contact your surgeon's office.   FOLLOW BOWEL PREP AND ANY ADDITIONAL PRE OP INSTRUCTIONS YOU RECEIVED FROM YOUR SURGEON'S OFFICE!!!      Oral Hygiene is also important to reduce your risk of infection.                                    Remember - BRUSH YOUR TEETH THE MORNING OF SURGERY WITH YOUR REGULAR TOOTHPASTE  DENTURES  WILL BE REMOVED PRIOR TO SURGERY PLEASE DO NOT APPLY Poly grip OR ADHESIVES!!!   Do NOT smoke after Midnight   Stop all vitamins and herbal supplements 7 days before surgery.   Take these medicines the morning of surgery with A SIP OF WATER:  synthroid , claritin  if needed   DO NOT TAKE ANY ORAL DIABETIC MEDICATIONS DAY OF YOUR SURGERY  Bring CPAP mask and tubing day of surgery.                              You may not have any metal on your body including hair pins, jewelry, and body piercing             Do not wear make-up, lotions, powders, perfumes/cologne, or deodorant  Do not wear nail polish including gel and S&S, artificial/acrylic nails, or any other type of covering on natural nails including finger and toenails. If you have artificial nails, gel coating, etc. that needs to be removed by a nail salon please have this removed prior to surgery or surgery may need to be canceled/ delayed if the surgeon/ anesthesia feels like they are unable to be safely monitored.   Do not shave  48 hours prior to surgery.               Men may shave face and neck.   Do not bring valuables to the hospital. [facility] IS NOT             RESPONSIBLE   FOR VALUABLES.   Contacts, glasses, dentures or bridgework may not be worn into surgery.   Bring small overnight bag day of surgery.   DO NOT BRING YOUR HOME MEDICATIONS TO THE HOSPITAL. PHARMACY WILL DISPENSE MEDICATIONS LISTED ON YOUR MEDICATION LIST TO YOU DURING YOUR ADMISSION IN THE HOSPITAL!    Patients discharged on the day of surgery will not be allowed to drive home.  Someone NEEDS to stay with you for the first 24 hours after anesthesia.   Special Instructions: Bring a copy of your healthcare power of attorney and living will documents the day  of surgery if you haven't scanned them before.              Please read over the following fact sheets you were given: IF YOU HAVE QUESTIONS ABOUT YOUR PRE-OP INSTRUCTIONS PLEASE CALL [phone]   If you received a COVID test during your pre-op visit  it is requested that you wear a mask when out in public, stay away from anyone that may not be feeling well and notify your surgeon if you develop symptoms. If you test positive for Covid or have been in contact with anyone that has tested positive in the last 10 days please notify you surgeon.    [facility] - Preparing for Surgery Before surgery, you can play an important role.  Because skin is not sterile, your skin needs to be as free of germs as possible.  You can reduce the number of germs on your skin by washing with CHG (chlorahexidine gluconate) soap before surgery.  CHG is an antiseptic cleaner which kills germs and bonds with the skin to continue killing germs even after washing. Please DO NOT use if you have an allergy to CHG or antibacterial soaps.  If your skin becomes reddened/irritated stop using the CHG and inform your nurse when you arrive at Short Stay. Do not shave (including legs and underarms) for  at least 48 hours prior to the first CHG shower.  You may shave your face/neck. Please follow these instructions carefully:  1.  Shower with CHG Soap the night before surgery and the  morning of Surgery.  2.  If you choose to wash your hair, wash your hair first as usual with your  normal  shampoo.  3.  After you shampoo, rinse your hair and body thoroughly to remove the  shampoo.                           4.  Use CHG as you would any other liquid soap.  You can apply chg directly  to the skin and wash                       Gently with a scrungie or clean washcloth.  5.  Apply the CHG Soap to your body ONLY FROM THE NECK DOWN.   Do not use on face/ open                           Wound or open sores. Avoid contact with eyes, ears mouth  and genitals (private parts).                       Wash face,  Genitals (private parts) with your normal soap.             6.  Wash thoroughly, paying special attention to the area where your surgery  will be performed.  7.  Thoroughly rinse your body with warm water from the neck down.  8.  DO NOT shower/wash with your normal soap after using and rinsing off  the CHG Soap.                9.  Pat yourself dry with a clean towel.            10.  Wear clean pajamas.            11.  Place clean sheets on your bed the night of your first shower and do not  sleep with pets. Day of Surgery : Do not apply any lotions/deodorants the morning of surgery.  Please wear clean clothes to the hospital/surgery center.  FAILURE TO FOLLOW THESE INSTRUCTIONS MAY RESULT IN THE CANCELLATION OF YOUR SURGERY PATIENT SIGNATURE_________________________________  NURSE SIGNATURE__________________________________  ________________________________________________________________________

## 2023-11-21 ENCOUNTER — Encounter (HOSPITAL_COMMUNITY)
Admission: RE | Admit: 2023-11-21 | Discharge: 2023-11-21 | Disposition: A | Discharge status: Home / Self Care | Source: Ambulatory Visit | Attending: Family Medicine | Admitting: Family Medicine

## 2023-11-24 ENCOUNTER — Encounter (HOSPITAL_COMMUNITY): Payer: Self-pay

## 2023-11-24 NOTE — Patient Instructions (Addendum)
 SURGICAL WAITING ROOM VISITATION Patients having surgery or a procedure may have no more than 2 support people in the waiting area - these visitors may rotate.    Children under the age of 82 must have an adult with them who is not the patient.  If the patient needs to stay at the hospital during part of their recovery, the visitor guidelines for inpatient rooms apply. Pre-op nurse will coordinate an appropriate time for 1 support person to accompany patient in pre-op.  This support person may not rotate.    Please refer to the [facility] website for the visitor guidelines for Inpatients (after your surgery is over and you are in a regular room).       Your procedure is scheduled on: 12-04-23   Report to [facility] Main Entrance    Report to admitting at 9:45 AM   Call this number if you have problems the morning of surgery [phone]   Follow a clear liquid diet the day before surgery   Do not eat food or drink liquids :After Midnight.          If you have questions, please contact your surgeon's office.   FOLLOW BOWEL PREP AND ANY ADDITIONAL PRE OP INSTRUCTIONS YOU RECEIVED FROM YOUR SURGEON'S OFFICE!!!      Magnesium citrate - drink 8 ounces at noon the day before surgery    Fleet enema - administer the night before surgery  Oral Hygiene is also important to reduce your risk of infection.                                    Remember - BRUSH YOUR TEETH THE MORNING OF SURGERY WITH YOUR REGULAR TOOTHPASTE   Do NOT smoke after Midnight   Take these medicines the morning of surgery with A SIP OF WATER:    Atorvastatin    Claritin    Levothyroxine   Stop all vitamins and herbal supplements 7 days before surgery                              You may not have any metal on your body including hair pins, jewelry, and body piercing             Do not wear make-up, lotions, powders, perfumes, or deodorant  Do not wear nail polish including gel and S&S, artificial/acrylic  nails, or any other type of covering on natural nails including finger and toenails. If you have artificial nails, gel coating, etc. that needs to be removed by a nail salon please have this removed prior to surgery or surgery may need to be canceled/ delayed if the surgeon/ anesthesia feels like they are unable to be safely monitored.   Do not shave  48 hours prior to surgery.       Do not bring valuables to the hospital. [facility] IS NOT RESPONSIBLE   FOR VALUABLES.   Contacts, dentures or bridgework may not be worn into surgery.   Bring small overnight bag day of surgery.   DO NOT BRING YOUR HOME MEDICATIONS TO THE HOSPITAL. PHARMACY WILL DISPENSE MEDICATIONS LISTED ON YOUR MEDICATION LIST TO YOU DURING YOUR ADMISSION IN THE HOSPITAL!    Special Instructions: Bring a copy of your healthcare power of attorney and living will documents the day of surgery if you haven't scanned them before.  Please read over the following fact sheets you were given: IF YOU HAVE QUESTIONS ABOUT YOUR PRE-OP INSTRUCTIONS PLEASE CALL [phone] [facility]  If you received a COVID test during your pre-op visit  it is requested that you wear a mask when out in public, stay away from anyone that may not be feeling well and notify your surgeon if you develop symptoms. If you test positive for Covid or have been in contact with anyone that has tested positive in the last 10 days please notify you surgeon.  [facility] - Preparing for Surgery Before surgery, you can play an important role.  Because skin is not sterile, your skin needs to be as free of germs as possible.  You can reduce the number of germs on your skin by washing with CHG (chlorahexidine gluconate) soap before surgery.  CHG is an antiseptic cleaner which kills germs and bonds with the skin to continue killing germs even after washing. Please DO NOT use if you have an allergy to CHG or antibacterial soaps.  If your skin becomes reddened/irritated  stop using the CHG and inform your nurse when you arrive at Short Stay. Do not shave (including legs and underarms) for at least 48 hours prior to the first CHG shower.  You may shave your face/neck.  Please follow these instructions carefully:  1.  Shower with CHG Soap the night before surgery and the  morning of surgery.  2.  If you choose to wash your hair, wash your hair first as usual with your normal  shampoo.  3.  After you shampoo, rinse your hair and body thoroughly to remove the shampoo.                             4.  Use CHG as you would any other liquid soap.  You can apply chg directly to the skin and wash.  Gently with a scrungie or clean washcloth.  5.  Apply the CHG Soap to your body ONLY FROM THE NECK DOWN.   Do   not use on face/ open                           Wound or open sores. Avoid contact with eyes, ears mouth and   genitals (private parts).                       Wash face,  Genitals (private parts) with your normal soap.             6.  Wash thoroughly, paying special attention to the area where your    surgery  will be performed.  7.  Thoroughly rinse your body with warm water from the neck down.  8.  DO NOT shower/wash with your normal soap after using and rinsing off the CHG Soap.                9.  Pat yourself dry with a clean towel.            10.  Wear clean pajamas.            11.  Place clean sheets on your bed the night of your first shower and do not  sleep with pets. Day of Surgery : Do not apply any lotions/deodorants the morning of surgery.  Please wear clean clothes to the hospital/surgery center.  FAILURE TO  FOLLOW THESE INSTRUCTIONS MAY RESULT IN THE CANCELLATION OF YOUR SURGERY  PATIENT SIGNATURE_________________________________  NURSE SIGNATURE__________________________________  ________________________________________________________________________   WHAT IS A BLOOD TRANSFUSION? Blood Transfusion Information  A transfusion is the  replacement of blood or some of its parts. Blood is made up of multiple cells which provide different functions. Red blood cells carry oxygen and are used for blood loss replacement. White blood cells fight against infection. Platelets control bleeding. Plasma helps clot blood. Other blood products are available for specialized needs, such as hemophilia or other clotting disorders. BEFORE THE TRANSFUSION  Who gives blood for transfusions?  Healthy volunteers who are fully evaluated to make sure their blood is safe. This is blood bank blood. Transfusion therapy is the safest it has ever been in the practice of medicine. Before blood is taken from a donor, a complete history is taken to make sure that person has no history of diseases nor engages in risky social behavior (examples are intravenous drug use or sexual activity with multiple partners). The donor's travel history is screened to minimize risk of transmitting infections, such as malaria. The donated blood is tested for signs of infectious diseases, such as HIV and hepatitis. The blood is then tested to be sure it is compatible with you in order to minimize the chance of a transfusion reaction. If you or a relative donates blood, this is often done in anticipation of surgery and is not appropriate for emergency situations. It takes many days to process the donated blood. RISKS AND COMPLICATIONS Although transfusion therapy is very safe and saves many lives, the main dangers of transfusion include:  Getting an infectious disease. Developing a transfusion reaction. This is an allergic reaction to something in the blood you were given. Every precaution is taken to prevent this. The decision to have a blood transfusion has been considered carefully by your caregiver before blood is given. Blood is not given unless the benefits outweigh the risks. AFTER THE TRANSFUSION Right after receiving a blood transfusion, you will usually feel much better and  more energetic. This is especially true if your red blood cells have gotten low (anemic). The transfusion raises the level of the red blood cells which carry oxygen, and this usually causes an energy increase. The nurse administering the transfusion will monitor you carefully for complications. HOME CARE INSTRUCTIONS  No special instructions are needed after a transfusion. You may find your energy is better. Speak with your caregiver about any limitations on activity for underlying diseases you may have. SEEK MEDICAL CARE IF:  Your condition is not improving after your transfusion. You develop redness or irritation at the intravenous (IV) site. SEEK IMMEDIATE MEDICAL CARE IF:  Any of the following symptoms occur over the next 12 hours: Shaking chills. You have a temperature by mouth above 102 F (38.9 C), not controlled by medicine. Chest, back, or muscle pain. People around you feel you are not acting correctly or are confused. Shortness of breath or difficulty breathing. Dizziness and fainting. You get a rash or develop hives. You have a decrease in urine output. Your urine turns a dark color or changes to pink, red, or brown. Any of the following symptoms occur over the next 10 days: You have a temperature by mouth above 102 F (38.9 C), not controlled by medicine. Shortness of breath. Weakness after normal activity. The white part of the eye turns yellow (jaundice). You have a decrease in the amount of urine or are urinating less often. Your urine  turns a dark color or changes to pink, red, or brown. Document Released: 04/22/2000 Document Revised: 07/18/2011 Document Reviewed: 12/10/2007 [facility] Patient Information 2014 [place], [place].  _______________________________________________________________________

## 2023-11-24 NOTE — Progress Notes (Addendum)
 COVID Vaccine Completed:  Date of COVID positive in last 90 days:  No  PCP - Montie Pizza, MD Cardiologist - Toribio Fuel, MD  Cardiac clearance in Epic dated 11-02-23  Chest x-ray - 08-23-23 Epic EKG - 11-02-23 Epic Stress Test - N/A ECHO - 10-18-23 Epic Cardiac Cath - 03-12-18 Epic Pacemaker/ICD device last checked: N/A Spinal Cord Stimulator:  N/A  Bowel Prep - Yes, Mag Citrate and Fleet enema.  Patient has instructions  Sleep Study -   N/A CPAP -   Fasting Blood Sugar -  Checks Blood Sugar - does not check   Last dose of GLP1 agonist-  N/A GLP1 instructions:  Do not take after     Last dose of SGLT-2 inhibitors-  N/A SGLT-2 instructions:  Do not take after    Blood Thinner Instructions:  N/A Aspirin Instructions: Last Dose:  Activity level:  Can go up a flight of stairs and perform activities of daily living without stopping and without symptoms of chest pain or shortness of breath.  Anesthesia review: Hx of MI, Takotsubo cardiomyopathy, HTN, DM, Sarcoidosis, scleroderma  Patient denies shortness of breath, fever, cough and chest pain at PAT appointment  Patient verbalized understanding of instructions that were given to them at the PAT appointment. Patient was also instructed that they will need to review over the PAT instructions again at home before surgery.

## 2023-11-28 ENCOUNTER — Other Ambulatory Visit: Payer: Self-pay

## 2023-11-28 ENCOUNTER — Encounter (HOSPITAL_COMMUNITY): Payer: Self-pay

## 2023-11-28 ENCOUNTER — Encounter (HOSPITAL_COMMUNITY)
Admission: RE | Admit: 2023-11-28 | Discharge: 2023-11-28 | Disposition: A | Discharge status: Home / Self Care | Payer: Self-pay | Source: Ambulatory Visit | Attending: Urology | Admitting: Urology

## 2023-11-28 VITALS — BP 148/89 | HR 81 | Temp 98.5°F | Resp 12 | Ht 66.0 in | Wt 124.8 lb

## 2023-11-28 DIAGNOSIS — I509 Heart failure, unspecified: Secondary | ICD-10-CM | POA: Diagnosis not present

## 2023-11-28 DIAGNOSIS — M349 Systemic sclerosis, unspecified: Secondary | ICD-10-CM | POA: Diagnosis not present

## 2023-11-28 DIAGNOSIS — N189 Chronic kidney disease, unspecified: Secondary | ICD-10-CM | POA: Diagnosis not present

## 2023-11-28 DIAGNOSIS — Z9089 Acquired absence of other organs: Secondary | ICD-10-CM | POA: Insufficient documentation

## 2023-11-28 DIAGNOSIS — E890 Postprocedural hypothyroidism: Secondary | ICD-10-CM | POA: Insufficient documentation

## 2023-11-28 DIAGNOSIS — Z79890 Hormone replacement therapy (postmenopausal): Secondary | ICD-10-CM | POA: Insufficient documentation

## 2023-11-28 DIAGNOSIS — E1122 Type 2 diabetes mellitus with diabetic chronic kidney disease: Secondary | ICD-10-CM | POA: Diagnosis not present

## 2023-11-28 DIAGNOSIS — Z01812 Encounter for preprocedural laboratory examination: Secondary | ICD-10-CM | POA: Insufficient documentation

## 2023-11-28 DIAGNOSIS — E119 Type 2 diabetes mellitus without complications: Secondary | ICD-10-CM

## 2023-11-28 DIAGNOSIS — D49512 Neoplasm of unspecified behavior of left kidney: Secondary | ICD-10-CM | POA: Diagnosis not present

## 2023-11-28 DIAGNOSIS — N2889 Other specified disorders of kidney and ureter: Secondary | ICD-10-CM | POA: Diagnosis not present

## 2023-11-28 DIAGNOSIS — I130 Hypertensive heart and chronic kidney disease with heart failure and stage 1 through stage 4 chronic kidney disease, or unspecified chronic kidney disease: Secondary | ICD-10-CM | POA: Diagnosis not present

## 2023-11-28 DIAGNOSIS — Z01818 Encounter for other preprocedural examination: Secondary | ICD-10-CM

## 2023-11-28 HISTORY — DX: Heart failure, unspecified: I50.9

## 2023-11-28 HISTORY — DX: Anemia, unspecified: D64.9

## 2023-11-28 HISTORY — DX: Hypothyroidism, unspecified: E03.9

## 2023-11-28 HISTORY — DX: Chronic kidney disease, unspecified: N18.9

## 2023-11-28 LAB — BASIC METABOLIC PANEL WITH GFR
Anion gap: 10 (ref 5–15)
BUN: 34 mg/dL — ABNORMAL HIGH (ref 8–23)
CO2: 23 mmol/L (ref 22–32)
Calcium: 8.8 mg/dL — ABNORMAL LOW (ref 8.9–10.3)
Chloride: 101 mmol/L (ref 98–111)
Creatinine, Ser: 1.09 mg/dL — ABNORMAL HIGH (ref 0.44–1.00)
GFR, Estimated: 52 mL/min — ABNORMAL LOW (ref >60)
Glucose, Bld: 96 mg/dL (ref 70–99)
Potassium: 4.2 mmol/L (ref 3.5–5.1)
Sodium: 134 mmol/L — ABNORMAL LOW (ref 135–145)

## 2023-11-28 LAB — CBC
HCT: 32.9 % — ABNORMAL LOW (ref 36.0–46.0)
Hemoglobin: 10 g/dL — ABNORMAL LOW (ref 12.0–15.0)
MCH: 27.5 pg (ref 26.0–34.0)
MCHC: 30.4 g/dL (ref 30.0–36.0)
MCV: 90.4 fL (ref 80.0–100.0)
Platelets: 384 K/uL (ref 150–400)
RBC: 3.64 MIL/uL — ABNORMAL LOW (ref 3.87–5.11)
RDW: 14.7 % (ref 11.5–15.5)
WBC: 6.5 K/uL (ref 4.0–10.5)
nRBC: 0 % (ref 0.0–0.2)

## 2023-11-28 LAB — HEMOGLOBIN A1C
Hgb A1c MFr Bld: 5.9 % — ABNORMAL HIGH (ref 4.8–5.6)
Mean Plasma Glucose: 122.63 mg/dL

## 2023-11-28 LAB — GLUCOSE, CAPILLARY: Glucose-Capillary: 91 mg/dL (ref 70–99)

## 2023-11-29 NOTE — Progress Notes (Signed)
 Anesthesia Chart Review   Case: [id number] Date/Time: 12/04/23 1145   Procedure: NEPHRECTOMY, RADICAL, ROBOT-ASSISTED, LAPAROSCOPIC, ADULT (Left)   Anesthesia type: General   Diagnosis: Neoplasm of left kidney [D49.512]   Pre-op diagnosis: LEFT RENAL MASS   Location: [facility] ROOM 03 / [facility]   Surgeons: Selma Donnice SAUNDERS, MD       DISCUSSION:78 y.o. never smoker with h/o HTN, hypothyroidism s/p thyroidectomy on Synthroid , scleroderma, takotsubo cardiomyopathy recovered EF, CKD, DM II (A1C 5.9), left renal mass scheduled for above procedure 12/04/2023 with Dr. Donnice Selma.   H/o lumbar fusion, L2-3, L3-4, L4-5.   Pt last seen by cardiology 11/02/2023. Pt with h/o scleroderma, stable, no indication for developing PAH or ILD. Cath 2012 with normal coronaries. Pt with distant h/o Takotsubo cardiomyopathy related from acute stress following a thyroid  surgery.  Echo 10/18/2023 with EF 55-60%. HTN stable. Per notes, she is low risk for peri-op CV complications. Can proceed without further testing  VS: BP (!) 148/89   Pulse 81   Temp 36.9 C (Oral)   Resp 12   Ht 5' 6 (1.676 m)   Wt 56.6 kg   SpO2 99%   BMI 20.14 kg/m   PROVIDERS: Teresa Channel, MD is PCP  Cardiologist - Toribio Fuel, MD   LABS: Labs reviewed: Acceptable for surgery. (all labs ordered are listed, but only abnormal results are displayed)  Labs Reviewed  HEMOGLOBIN A1C - Abnormal; Notable for the following components:      Result Value   Hgb A1c MFr Bld 5.9 (*)    All other components within normal limits  BASIC METABOLIC PANEL WITH GFR - Abnormal; Notable for the following components:   Sodium 134 (*)    BUN 34 (*)    Creatinine, Ser 1.09 (*)    Calcium  8.8 (*)    GFR, Estimated 52 (*)    All other components within normal limits  CBC - Abnormal; Notable for the following components:   RBC 3.64 (*)    Hemoglobin 10.0 (*)    HCT 32.9 (*)    All other components within normal limits  GLUCOSE, CAPILLARY  TYPE  AND SCREEN     IMAGES:   EKG:   CV: Echo 10/18/23 1. Left ventricular ejection fraction, by estimation, is 55 to 60%. The  left ventricle has normal function. The left ventricle has no regional  wall motion abnormalities. Left ventricular diastolic parameters are  consistent with Grade I diastolic  dysfunction (impaired relaxation).   2. Right ventricular systolic function is normal. The right ventricular  size is normal.   3. The mitral valve is abnormal. No evidence of mitral valve  regurgitation. No evidence of mitral stenosis. Moderate mitral annular  calcification.   4. The aortic valve is tricuspid. There is mild calcification of the  aortic valve. There is mild thickening of the aortic valve. Aortic valve  regurgitation is not visualized. Aortic valve sclerosis is present, with  no evidence of aortic valve stenosis.   5. The inferior vena cava is normal in size with greater than 50%  respiratory variability, suggesting right atrial pressure of 3 mmHg.   Past Medical History:  Diagnosis Date   Anemia    Cancer (HCC)    thyroid    CHF (congestive heart failure) (HCC)    Chronic kidney disease    Diabetes mellitus    Hyperlipidemia    Hypertension    Hypothyroidism    NSTEMI (non-ST elevated myocardial infarction) (HCC) 12/2010  pulmonary edema & NSTEMI post-thyroidectomy; 12/31/10 [facility]: Normal coronaries, moderate LV dysfunction, EF 40%   Osteopenia    Scleroderma (HCC)    Scoliosis    Takotsubo cardiomyopathy 12/2010   post-thyroidectomy   Thyroid  goiter    s/p thyroidectomy    Past Surgical History:  Procedure Laterality Date   BACK SURGERY     CARDIAC CATHETERIZATION  12/31/2010   Left ventricular angiography performed in the RAO view demonstrates  moderate-to-severe hypokinesis involving the mid-to-distal anterior  wall, distal inferior wall, and apex.  Overall, systolic function is  moderately reduced with ejection fraction estimated at 40%.     radioactiveiodine     thyrotoxicosis 1999   RIGHT HEART CATH N/A 03/12/2018   Procedure: RIGHT HEART CATH;  Surgeon: Cherrie Toribio SAUNDERS, MD;  Location: [facility] INVASIVE CV LAB;  Service: Cardiovascular;  Laterality: N/A;   THYROIDECTOMY     12/28/2010   TONSILLECTOMY     TRANSTHORACIC ECHOCARDIOGRAM  12/30/2010   Left ventricle: LVEF is approximately 25 to 30% with hypokinesis  of the distal inferoseptal, mid/distal lateral, mid/distal   anterior, distal inferior, mid/distal mid/distal posterior and   apical walls   TRANSTHORACIC ECHOCARDIOGRAM  10/28/2010   Left ventricle: The cavity size was normal. There was mild concentric hypertrophy. Systolic function was lower limits of normal. The estimated ejection fraction was in the range of 50% to 55%.     MEDICATIONS:  atorvastatin  (LIPITOR) 40 MG tablet   B Complex-C (SUPER B COMPLEX PO)   Calcium  Carb-Cholecalciferol (CALCIUM +D3 PO)   Cholecalciferol (VITAMIN D-3) 25 MCG (1000 UT) CAPS   enalapril  (VASOTEC ) 20 MG tablet   ferrous sulfate 325 (65 FE) MG tablet   hydrochlorothiazide  (MICROZIDE ) 12.5 MG capsule   levothyroxine  (SYNTHROID ) 100 MCG tablet   loratadine  (CLARITIN ) 10 MG tablet   Magnesium 250 MG TABS   Multiple Vitamins-Minerals (MULTIVITAMIN GUMMIES ADULT PO)   Omega-3 Fatty Acids (FISH OIL) 300 MG CAPS   vitamin E 180 MG (400 UNITS) capsule   No current facility-administered medications for this encounter.     Harlene Hoots Ward, PA-C [facility]-Surgical Testing [phone]

## 2023-11-29 NOTE — Anesthesia Preprocedure Evaluation (Addendum)
 Anesthesia Evaluation  Patient identified by MRN, date of birth, ID band Patient awake    Reviewed: Allergy & Precautions, H&P , NPO status , Patient's Chart, lab work & pertinent test results  Airway Mallampati: II   Neck ROM: full    Dental   Pulmonary neg pulmonary ROS   breath sounds clear to auscultation       Cardiovascular hypertension, +CHF   Rhythm:regular Rate:Normal     Neuro/Psych    GI/Hepatic   Endo/Other  diabetes, Type 2Hypothyroidism    Renal/GU Renal disease     Musculoskeletal   Abdominal   Peds  Hematology   Anesthesia Other Findings   Reproductive/Obstetrics                              Anesthesia Physical Anesthesia Plan  ASA: 3  Anesthesia Plan: General   Post-op Pain Management:    Induction: Intravenous  PONV Risk Score and Plan: 3 and Ondansetron , Dexamethasone  and Treatment may vary due to age or medical condition  Airway Management Planned: Oral ETT  Additional Equipment: Arterial line  Intra-op Plan:   Post-operative Plan: Extubation in OR  Informed Consent: I have reviewed the patients History and Physical, chart, labs and discussed the procedure including the risks, benefits and alternatives for the proposed anesthesia with the patient or authorized representative who has indicated his/her understanding and acceptance.     Dental advisory given  Plan Discussed with: CRNA, Anesthesiologist and Surgeon  Anesthesia Plan Comments: (See PAT note 11/28/23)         Anesthesia Quick Evaluation

## 2023-12-04 ENCOUNTER — Encounter (HOSPITAL_COMMUNITY)
Admission: RE | Disposition: A | Discharge status: Home / Self Care | Payer: Self-pay | Source: Home / Self Care | Attending: Urology

## 2023-12-04 ENCOUNTER — Encounter (HOSPITAL_COMMUNITY): Payer: Self-pay | Admitting: Urology

## 2023-12-04 ENCOUNTER — Inpatient Hospital Stay (HOSPITAL_COMMUNITY)
Admission: RE | Admit: 2023-12-04 | Discharge: 2023-12-05 | DRG: 657 | Disposition: A | Discharge status: Home / Self Care | Attending: Urology | Admitting: Urology

## 2023-12-04 ENCOUNTER — Inpatient Hospital Stay (HOSPITAL_COMMUNITY)

## 2023-12-04 ENCOUNTER — Other Ambulatory Visit: Payer: Self-pay

## 2023-12-04 ENCOUNTER — Inpatient Hospital Stay (HOSPITAL_COMMUNITY): Payer: Self-pay | Admitting: Physician Assistant

## 2023-12-04 DIAGNOSIS — I252 Old myocardial infarction: Secondary | ICD-10-CM | POA: Diagnosis not present

## 2023-12-04 DIAGNOSIS — M349 Systemic sclerosis, unspecified: Secondary | ICD-10-CM | POA: Diagnosis not present

## 2023-12-04 DIAGNOSIS — N2889 Other specified disorders of kidney and ureter: Secondary | ICD-10-CM

## 2023-12-04 DIAGNOSIS — Z79899 Other long term (current) drug therapy: Secondary | ICD-10-CM

## 2023-12-04 DIAGNOSIS — E7800 Pure hypercholesterolemia, unspecified: Secondary | ICD-10-CM | POA: Diagnosis present

## 2023-12-04 DIAGNOSIS — I130 Hypertensive heart and chronic kidney disease with heart failure and stage 1 through stage 4 chronic kidney disease, or unspecified chronic kidney disease: Secondary | ICD-10-CM | POA: Diagnosis not present

## 2023-12-04 DIAGNOSIS — Z79890 Hormone replacement therapy (postmenopausal): Secondary | ICD-10-CM | POA: Diagnosis not present

## 2023-12-04 DIAGNOSIS — I5181 Takotsubo syndrome: Secondary | ICD-10-CM | POA: Diagnosis present

## 2023-12-04 DIAGNOSIS — Z8249 Family history of ischemic heart disease and other diseases of the circulatory system: Secondary | ICD-10-CM

## 2023-12-04 DIAGNOSIS — C642 Malignant neoplasm of left kidney, except renal pelvis: Secondary | ICD-10-CM | POA: Diagnosis not present

## 2023-12-04 DIAGNOSIS — I7300 Raynaud's syndrome without gangrene: Secondary | ICD-10-CM | POA: Diagnosis present

## 2023-12-04 DIAGNOSIS — E039 Hypothyroidism, unspecified: Secondary | ICD-10-CM | POA: Diagnosis not present

## 2023-12-04 DIAGNOSIS — E119 Type 2 diabetes mellitus without complications: Secondary | ICD-10-CM

## 2023-12-04 DIAGNOSIS — Z803 Family history of malignant neoplasm of breast: Secondary | ICD-10-CM | POA: Diagnosis not present

## 2023-12-04 DIAGNOSIS — Z8261 Family history of arthritis: Secondary | ICD-10-CM | POA: Diagnosis not present

## 2023-12-04 DIAGNOSIS — E890 Postprocedural hypothyroidism: Secondary | ICD-10-CM | POA: Diagnosis present

## 2023-12-04 DIAGNOSIS — Z85850 Personal history of malignant neoplasm of thyroid: Secondary | ICD-10-CM

## 2023-12-04 DIAGNOSIS — D49512 Neoplasm of unspecified behavior of left kidney: Secondary | ICD-10-CM | POA: Diagnosis not present

## 2023-12-04 DIAGNOSIS — I10 Essential (primary) hypertension: Secondary | ICD-10-CM

## 2023-12-04 DIAGNOSIS — M8580 Other specified disorders of bone density and structure, unspecified site: Secondary | ICD-10-CM | POA: Diagnosis present

## 2023-12-04 DIAGNOSIS — E1122 Type 2 diabetes mellitus with diabetic chronic kidney disease: Secondary | ICD-10-CM | POA: Diagnosis not present

## 2023-12-04 DIAGNOSIS — N1832 Chronic kidney disease, stage 3b: Secondary | ICD-10-CM | POA: Diagnosis not present

## 2023-12-04 DIAGNOSIS — I110 Hypertensive heart disease with heart failure: Secondary | ICD-10-CM | POA: Diagnosis not present

## 2023-12-04 DIAGNOSIS — I509 Heart failure, unspecified: Secondary | ICD-10-CM | POA: Diagnosis not present

## 2023-12-04 HISTORY — PX: ROBOT ASSISTED LAPAROSCOPIC NEPHRECTOMY: SHX5140

## 2023-12-04 LAB — GLUCOSE, CAPILLARY
Glucose-Capillary: 119 mg/dL — ABNORMAL HIGH (ref 70–99)
Glucose-Capillary: 111 mg/dL — ABNORMAL HIGH (ref 70–99)

## 2023-12-04 LAB — HEMOGLOBIN AND HEMATOCRIT, BLOOD
HCT: 29.4 % — ABNORMAL LOW (ref 36.0–46.0)
Hemoglobin: 9.1 g/dL — ABNORMAL LOW (ref 12.0–15.0)

## 2023-12-04 LAB — TYPE AND SCREEN
ABO/RH(D): O POS
Antibody Screen: NEGATIVE

## 2023-12-04 SURGERY — NEPHRECTOMY, RADICAL, ROBOT-ASSISTED, LAPAROSCOPIC, ADULT
Anesthesia: General | Site: Abdomen | Laterality: Left

## 2023-12-04 MED ORDER — PROPOFOL 1000 MG/100ML IV EMUL
INTRAVENOUS | Status: AC
  Filled 2023-12-04: qty 100

## 2023-12-04 MED ORDER — LACTATED RINGERS IV SOLN: INTRAVENOUS | Status: DC | PRN

## 2023-12-04 MED ORDER — LACTATED RINGERS IV SOLN: INTRAVENOUS | Status: DC

## 2023-12-04 MED ORDER — BUPIVACAINE LIPOSOME 1.3 % IJ SUSP
INTRAMUSCULAR | Status: AC
  Filled 2023-12-04: qty 20

## 2023-12-04 MED ORDER — ONDANSETRON HCL 4 MG/2ML IJ SOLN: 4.0000 mg | Freq: Four times a day (QID) | INTRAMUSCULAR | Status: DC | PRN

## 2023-12-04 MED ORDER — SODIUM CHLORIDE (PF) 0.9 % IJ SOLN
INTRAMUSCULAR | Status: AC
  Filled 2023-12-04: qty 20

## 2023-12-04 MED ORDER — LIDOCAINE HCL (CARDIAC) PF 100 MG/5ML IV SOSY
PREFILLED_SYRINGE | INTRAVENOUS | Status: DC | PRN
  Administered 2023-12-04: 60 mg via INTRAVENOUS

## 2023-12-04 MED ORDER — ROCURONIUM BROMIDE 100 MG/10ML IV SOLN
INTRAVENOUS | Status: DC | PRN
  Administered 2023-12-04: 10 mg via INTRAVENOUS
  Administered 2023-12-04: 50 mg via INTRAVENOUS
  Administered 2023-12-04: 10 mg via INTRAVENOUS

## 2023-12-04 MED ORDER — ROCURONIUM BROMIDE 10 MG/ML (PF) SYRINGE
PREFILLED_SYRINGE | INTRAVENOUS | Status: AC
  Filled 2023-12-04: qty 10

## 2023-12-04 MED ORDER — HEMOSTATIC AGENTS (NO CHARGE) OPTIME
TOPICAL | Status: DC | PRN
Start: 2023-12-04 — End: 2023-12-04
  Administered 2023-12-04: 1 via TOPICAL

## 2023-12-04 MED ORDER — OXYCODONE HCL 5 MG PO TABS
5.0000 mg | ORAL_TABLET | ORAL | Status: DC | PRN
  Administered 2023-12-05 (×3): 5 mg via ORAL
  Filled 2023-12-04 (×3): qty 1

## 2023-12-04 MED ORDER — DEXAMETHASONE SODIUM PHOSPHATE 10 MG/ML IJ SOLN
INTRAMUSCULAR | Status: AC
  Filled 2023-12-04: qty 1

## 2023-12-04 MED ORDER — PHENYLEPHRINE HCL-NACL 20-0.9 MG/250ML-% IV SOLN
INTRAVENOUS | Status: DC | PRN
  Administered 2023-12-04: 160 ug via INTRAVENOUS
  Administered 2023-12-04: 40 ug/min via INTRAVENOUS

## 2023-12-04 MED ORDER — LEVOTHYROXINE SODIUM 100 MCG PO TABS
100.0000 ug | ORAL_TABLET | Freq: Every day | ORAL | Status: DC
  Administered 2023-12-05: 100 ug via ORAL
  Filled 2023-12-04: qty 1

## 2023-12-04 MED ORDER — SODIUM CHLORIDE (PF) 0.9 % IJ SOLN
INTRAMUSCULAR | Status: DC | PRN
  Administered 2023-12-04: 40 mL

## 2023-12-04 MED ORDER — OXYCODONE HCL 5 MG/5ML PO SOLN: 5.0000 mg | Freq: Once | ORAL | Status: DC | PRN

## 2023-12-04 MED ORDER — CHLORHEXIDINE GLUCONATE 0.12 % MT SOLN: 15.0000 mL | Freq: Once | OROMUCOSAL | Status: DC

## 2023-12-04 MED ORDER — ACETAMINOPHEN 500 MG PO TABS
1000.0000 mg | ORAL_TABLET | Freq: Four times a day (QID) | ORAL | Status: DC
  Administered 2023-12-04 – 2023-12-05 (×3): 1000 mg via ORAL
  Filled 2023-12-04 (×3): qty 2

## 2023-12-04 MED ORDER — ORAL CARE MOUTH RINSE: 15.0000 mL | Freq: Once | OROMUCOSAL | Status: DC

## 2023-12-04 MED ORDER — SODIUM CHLORIDE 0.9 % IV SOLN: INTRAVENOUS | Status: DC | PRN

## 2023-12-04 MED ORDER — HYDRALAZINE HCL 20 MG/ML IJ SOLN
INTRAMUSCULAR | Status: DC | PRN
  Administered 2023-12-04: 5 mg via INTRAVENOUS

## 2023-12-04 MED ORDER — FENTANYL CITRATE PF 50 MCG/ML IJ SOSY
PREFILLED_SYRINGE | INTRAMUSCULAR | Status: AC
  Filled 2023-12-04: qty 1

## 2023-12-04 MED ORDER — ONDANSETRON HCL 4 MG/2ML IJ SOLN
INTRAMUSCULAR | Status: DC | PRN
  Administered 2023-12-04: 4 mg via INTRAVENOUS

## 2023-12-04 MED ORDER — ALBUMIN HUMAN 5 % IV SOLN: INTRAVENOUS | Status: DC | PRN

## 2023-12-04 MED ORDER — STERILE WATER FOR IRRIGATION IR SOLN
Status: DC | PRN
  Administered 2023-12-04: 1000 mL

## 2023-12-04 MED ORDER — CLEVIDIPINE BUTYRATE 0.5 MG/ML IV EMUL
1.0000 mg/h | INTRAVENOUS | Status: DC
  Filled 2023-12-04: qty 50

## 2023-12-04 MED ORDER — HYDRALAZINE HCL 20 MG/ML IJ SOLN
INTRAMUSCULAR | Status: AC
  Filled 2023-12-04: qty 1

## 2023-12-04 MED ORDER — FENTANYL CITRATE (PF) 250 MCG/5ML IJ SOLN
INTRAMUSCULAR | Status: AC
  Filled 2023-12-04: qty 5

## 2023-12-04 MED ORDER — ALBUMIN HUMAN 5 % IV SOLN
INTRAVENOUS | Status: AC
  Filled 2023-12-04: qty 500

## 2023-12-04 MED ORDER — FENTANYL CITRATE (PF) 250 MCG/5ML IJ SOLN
INTRAMUSCULAR | Status: DC | PRN
  Administered 2023-12-04: 100 ug via INTRAVENOUS
  Administered 2023-12-04: 50 ug via INTRAVENOUS
  Administered 2023-12-04: 100 ug via INTRAVENOUS

## 2023-12-04 MED ORDER — ONDANSETRON HCL 4 MG/2ML IJ SOLN
4.0000 mg | INTRAMUSCULAR | Status: DC | PRN
Start: 2023-12-04 — End: 2023-12-05

## 2023-12-04 MED ORDER — PROPOFOL 10 MG/ML IV BOLUS
INTRAVENOUS | Status: DC | PRN
  Administered 2023-12-04: 20 mg via INTRAVENOUS
  Administered 2023-12-04: 80 mg via INTRAVENOUS
  Administered 2023-12-04: 100 mg via INTRAVENOUS

## 2023-12-04 MED ORDER — TRIPLE ANTIBIOTIC 3.5-400-5000 EX OINT: 1.0000 | TOPICAL_OINTMENT | Freq: Three times a day (TID) | CUTANEOUS | Status: DC | PRN

## 2023-12-04 MED ORDER — PROPOFOL 500 MG/50ML IV EMUL
INTRAVENOUS | Status: DC | PRN
  Administered 2023-12-04: 50 mg via INTRAVENOUS
  Administered 2023-12-04: 85 ug/kg/min via INTRAVENOUS

## 2023-12-04 MED ORDER — SUGAMMADEX SODIUM 200 MG/2ML IV SOLN
INTRAVENOUS | Status: AC
  Filled 2023-12-04: qty 2

## 2023-12-04 MED ORDER — OXYCODONE HCL 5 MG PO TABS: 5.0000 mg | ORAL_TABLET | Freq: Once | ORAL | Status: DC | PRN

## 2023-12-04 MED ORDER — PROPOFOL 10 MG/ML IV BOLUS
INTRAVENOUS | Status: AC
Start: 2023-12-04 — End: 2023-12-04
  Filled 2023-12-04: qty 20

## 2023-12-04 MED ORDER — CEFAZOLIN SODIUM-DEXTROSE 2-4 GM/100ML-% IV SOLN
2.0000 g | INTRAVENOUS | Status: AC
  Administered 2023-12-04: 2 g via INTRAVENOUS
  Filled 2023-12-04: qty 100

## 2023-12-04 MED ORDER — SUGAMMADEX SODIUM 200 MG/2ML IV SOLN
INTRAVENOUS | Status: DC | PRN
  Administered 2023-12-04: 120 mg via INTRAVENOUS

## 2023-12-04 MED ORDER — SODIUM CHLORIDE 0.9 % IV SOLN: INTRAVENOUS | Status: AC

## 2023-12-04 MED ORDER — ONDANSETRON HCL 4 MG/2ML IJ SOLN
INTRAMUSCULAR | Status: AC
  Filled 2023-12-04: qty 2

## 2023-12-04 MED ORDER — SENNOSIDES-DOCUSATE SODIUM 8.6-50 MG PO TABS
2.0000 | ORAL_TABLET | Freq: Every day | ORAL | Status: DC
  Administered 2023-12-04: 2 via ORAL
  Filled 2023-12-04: qty 2

## 2023-12-04 MED ORDER — DEXAMETHASONE SODIUM PHOSPHATE 10 MG/ML IJ SOLN
INTRAMUSCULAR | Status: DC | PRN
  Administered 2023-12-04: 5 mg via INTRAVENOUS

## 2023-12-04 MED ORDER — INSULIN ASPART 100 UNIT/ML IJ SOLN: 0.0000 [IU] | INTRAMUSCULAR | Status: DC | PRN

## 2023-12-04 MED ORDER — ATORVASTATIN CALCIUM 40 MG PO TABS
40.0000 mg | ORAL_TABLET | Freq: Every day | ORAL | Status: DC
  Administered 2023-12-05: 40 mg via ORAL
  Filled 2023-12-04 (×2): qty 1

## 2023-12-04 MED ORDER — FENTANYL CITRATE PF 50 MCG/ML IJ SOSY
25.0000 ug | PREFILLED_SYRINGE | INTRAMUSCULAR | Status: DC | PRN
  Administered 2023-12-04: 25 ug via INTRAVENOUS

## 2023-12-04 MED ORDER — LIDOCAINE HCL (PF) 2 % IJ SOLN
INTRAMUSCULAR | Status: AC
  Filled 2023-12-04: qty 5

## 2023-12-04 SURGICAL SUPPLY — 54 items
APPLICATOR SURGIFLO ENDO (HEMOSTASIS) IMPLANT
BAG COUNTER SPONGE SURGICOUNT (BAG) ×1 IMPLANT
BAG LAPAROSCOPIC 12 15 PORT 16 (BASKET) ×1 IMPLANT
CHLORAPREP W/TINT 26 (MISCELLANEOUS) ×1 IMPLANT
CLIP LIGATING HEM O LOK PURPLE (MISCELLANEOUS) ×1 IMPLANT
CLIP LIGATING HEMO LOK XL GOLD (MISCELLANEOUS) ×1 IMPLANT
CLIP LIGATING HEMO O LOK GREEN (MISCELLANEOUS) ×1 IMPLANT
COVER SURGICAL LIGHT HANDLE (MISCELLANEOUS) ×1 IMPLANT
COVER TIP SHEARS 8 DVNC (MISCELLANEOUS) ×1 IMPLANT
DERMABOND ADVANCED .7 DNX12 (GAUZE/BANDAGES/DRESSINGS) ×2 IMPLANT
DRAPE ARM DVNC X/XI (DISPOSABLE) ×4 IMPLANT
DRAPE COLUMN DVNC XI (DISPOSABLE) ×1 IMPLANT
DRAPE INCISE IOBAN 66X45 STRL (DRAPES) ×1 IMPLANT
DRAPE SHEET LG 3/4 BI-LAMINATE (DRAPES) ×1 IMPLANT
DRIVER NDL LRG 8 DVNC XI (INSTRUMENTS) IMPLANT
DRIVER NDLE LRG 8 DVNC XI (INSTRUMENTS) IMPLANT
ELECT PENCIL ROCKER SW 15FT (MISCELLANEOUS) ×1 IMPLANT
ELECT REM PT RETURN 15FT ADLT (MISCELLANEOUS) ×1 IMPLANT
FORCEPS BPLR FENES DVNC XI (FORCEP) ×1 IMPLANT
FORCEPS PROGRASP DVNC XI (FORCEP) ×1 IMPLANT
GLOVE BIO SURGEON STRL SZ 6.5 (GLOVE) ×1 IMPLANT
GLOVE BIOGEL M 7.0 STRL (GLOVE) ×2 IMPLANT
GLOVE BIOGEL PI IND STRL 7.5 (GLOVE) ×2 IMPLANT
GOWN STRL REUS W/ TWL XL LVL3 (GOWN DISPOSABLE) ×2 IMPLANT
GOWN STRL SURGICAL XL XLNG (GOWN DISPOSABLE) ×1 IMPLANT
HOLDER FOLEY CATH W/STRAP (MISCELLANEOUS) ×1 IMPLANT
IRRIGATION SUCT STRKRFLW 2 WTP (MISCELLANEOUS) ×1 IMPLANT
KIT BASIN OR (CUSTOM PROCEDURE TRAY) ×1 IMPLANT
KIT TURNOVER KIT A (KITS) ×1 IMPLANT
MARKER SKIN DUAL TIP RULER LAB (MISCELLANEOUS) ×1 IMPLANT
NDL INSUFFLATION 14GA 120MM (NEEDLE) ×1 IMPLANT
NEEDLE INSUFFLATION 14GA 120MM (NEEDLE) ×1 IMPLANT
PROTECTOR NERVE ULNAR (MISCELLANEOUS) ×2 IMPLANT
RELOAD STAPLE 45 2.6 WHT THIN (STAPLE) IMPLANT
SCISSORS LAP 5X45 EPIX DISP (ENDOMECHANICALS) IMPLANT
SCISSORS MNPLR CVD DVNC XI (INSTRUMENTS) ×1 IMPLANT
SEAL UNIV 5-12 XI (MISCELLANEOUS) ×4 IMPLANT
SEALER VESSEL EXT DVNC XI (MISCELLANEOUS) IMPLANT
SET TUBE SMOKE EVAC HIGH FLOW (TUBING) ×1 IMPLANT
SOLUTION ELECTROSURG ANTI STCK (MISCELLANEOUS) ×1 IMPLANT
SPIKE FLUID TRANSFER (MISCELLANEOUS) ×1 IMPLANT
SPONGE T-LAP 4X18 ~~LOC~~+RFID (SPONGE) IMPLANT
STAPLER POWER ECHELON 45 WIDE (STAPLE) IMPLANT
SURGIFLO W/THROMBIN 8M KIT (HEMOSTASIS) IMPLANT
SUT MNCRL AB 4-0 PS2 18 (SUTURE) ×2 IMPLANT
SUT PDS AB 1 CT1 27 (SUTURE) ×2 IMPLANT
SUT VIC AB 0 CT1 27XBRD ANTBC (SUTURE) ×2 IMPLANT
SUT VIC AB 2-0 SH 27X BRD (SUTURE) ×1 IMPLANT
TOWEL OR 17X26 10 PK STRL BLUE (TOWEL DISPOSABLE) ×1 IMPLANT
TRAY FOLEY MTR SLVR 16FR STAT (SET/KITS/TRAYS/PACK) ×1 IMPLANT
TRAY LAPAROSCOPIC (CUSTOM PROCEDURE TRAY) ×1 IMPLANT
TROCAR Z THREAD OPTICAL 12X100 (TROCAR) ×1 IMPLANT
TROCAR Z-THREAD OPTICAL 5X100M (TROCAR) IMPLANT
WATER STERILE IRR 1000ML POUR (IV SOLUTION) ×1 IMPLANT

## 2023-12-04 NOTE — H&P (Signed)
 Office Visit Report     11/28/2023   --------------------------------------------------------------------------------   Kaitlyn Allen  MRN: [id number]  DOB: [birth year], 78 year old Female  SSN:    PRIMARY CARE:  Montie Pizza, MD  PRIMARY CARE FAX:  [phone]  REFERRING:  Jayson Sherra Player, MD  PROVIDER:  Donnice Siad, M.D.  TREATING:  Alan Clois Hammonds, [place]  LOCATION:  [facility] [phone]     --------------------------------------------------------------------------------   CC/HPI: Pt presents today for pre-operative history and physical exam in anticipation of robotic assisted lap radical left nephrectomy by Dr. Siad on 12/04/23. She is doing well and is without complaint.   Cardiac clearance from Dr. Cherrie.   Pt denies F/C, HA, CP, SOB, N/V, diarrhea/constipation, back pain, flank pain, hematuria, and dysuria.    HX:    Kaitlyn Allen is a 78 year old female seen in consultation today for a large left renal mass.   1. Large left renal mass:  She was recently seen by nephrology to establish care for CKD given creatinine around 1.2-1.4 since 2019.  Renal ultrasound 07/31/2023 with a 8.2 cm solid mass in lower pole left kidney likely representing RCC.  -Subsequent CT abdomen with and without contrast 08/12/2023 with exophytic heterogenous solid enhancing 8.4 x 6 cm left lower pole neoplasm compatible with RCC. No evidence of renal vein tumor. On imaging, she has left main renal vein and artery. The artery appears to be branching. She also has several smaller accessory vessels to the large left lower pole mass medially.  -She denies abdominal pain or gross hematuria. She denies family history of urologic malignancy.   She has a past medical history of scleroderma, Raynaud's, hypothyroidism, hypertension, history of NSTEMI, CKD stage IIIb.  She has had a prior cardiac catheterization 2012, 2019. She has had a thyroidectomy in 2012. Apparently  she had an NSTEMI after the surgery in 2012. She has had posterior spinal fusion in December 2020 for and required transfusion afterwards.. She denies prior abdominal surgeries. Echocardiogram 06/2022 with ejection fraction 60%. She had grade 1 diastolic dysfunction.     ALLERGIES: Sulfa - Hives    MEDICATIONS: Atorvastatin  Calcium  40 MG Tablet 1 tablet PO Daily  B Complex-C  Calcium  Gummies  Claritin  10 MG Tablet  Enalapril  Maleate 20 MG Tablet 1 tablet PO Daily  Fish Oil  hydroCHLOROthiazide  12.5 MG Capsule 1 capsule PO Daily  Levothyroxine  Sodium 100 MCG Tablet 1 tablet PO Daily  Magnesium 250 MG Tablet  Multivitamin Gummies Adult  Tylenol   Vitamin D3     GU PSH: None     PSH Notes: Cardiac Catheterization, 12/31/10  Rt Heart Cath, 03/12/18   Lumbar surgery with Dr. Mavis 04/2023   NON-GU PSH: Thyroidectomy - 2012 Visit Complexity (formerly [facility]PC1X) - 08/17/2023     GU PMH: Chronic Kidney Disease - 08/17/2023 Left renal neoplasm - 08/17/2023    NON-GU PMH: Chronic kidney disease, stage 3b Hypercholesterolemia Hyperlipidemia, unspecified Hypertensive chronic kidney disease with stage 1 through stage 4 chronic kidney disease, or unspecified chronic kidney disease Hypothyroidism Malignant neoplasm of thyroid  gland Non-ST elevation (NSTEMI) myocardial infarction Nontoxic goiter, unspecified Other disorders of intestinal carbohydrate absorption Other disorders of phosphorus metabolism Prediabetes Raynaud's syndrome without gangrene Systemic sclerosis, unspecified    FAMILY HISTORY: Breast Cancer - Mother Coronary Artery Disease - Father Dementia - Mother heart failure - Father   SOCIAL HISTORY: Marital Status: Divorced Current Smoking Status: Patient has never smoked.   Tobacco Use Assessment Completed: Used  Tobacco in last 30 days? Does not use smokeless tobacco. Has never drank.  Does not use drugs. Drinks 3 caffeinated drinks per day. Has had a blood  transfusion.     Notes: after back surgery   REVIEW OF SYSTEMS:    GU Review Female:   Patient denies frequent urination, have to strain to urinate, burning /pain with urination, hard to postpone urination, leakage of urine, stream starts and stops, being pregnant, get up at night to urinate, and trouble starting your stream.  Gastrointestinal (Upper):   Patient denies nausea, vomiting, and indigestion/ heartburn.  Gastrointestinal (Lower):   Patient denies diarrhea and constipation.  Constitutional:   Patient denies fever, night sweats, weight loss, and fatigue.  Skin:   Patient denies skin rash/ lesion and itching.  Eyes:   Patient denies blurred vision and double vision.  Ears/ Nose/ Throat:   Patient denies sore throat and sinus problems.  Hematologic/Lymphatic:   Patient denies swollen glands and easy bruising.  Cardiovascular:   Patient denies leg swelling and chest pains.  Respiratory:   Patient denies cough and shortness of breath.  Endocrine:   Patient denies excessive thirst.  Musculoskeletal:   Patient denies back pain and joint pain.  Neurological:   Patient denies headaches and dizziness.  Psychologic:   Patient denies depression and anxiety.   VITAL SIGNS:      11/28/2023 01:27 PM  Weight 126 lb / 57.15 kg  BP 133/80 mmHg  Pulse 87 /min  Temperature 97.5 F / 36.3 C   MULTI-SYSTEM PHYSICAL EXAMINATION:    Constitutional: Well-nourished. No physical deformities. Normally developed. Good grooming.  Neck: Neck symmetrical, not swollen. Normal tracheal position.  Respiratory: Normal breath sounds. No labored breathing, no use of accessory muscles.   Cardiovascular: Regular rate and rhythm. No murmur, no gallop.   Lymphatic: No enlargement of neck, axillae, groin.  Skin: No paleness, no jaundice, no cyanosis. No lesion, no ulcer, no rash.  Neurologic / Psychiatric: Oriented to time, oriented to place, oriented to person. No depression, no anxiety, no agitation.   Gastrointestinal: No mass, no tenderness, no rigidity, non obese abdomen.  Eyes: Normal conjunctivae. Normal eyelids.  Ears, Nose, Mouth, and Throat: Left ear no scars, no lesions, no masses. Right ear no scars, no lesions, no masses. Nose no scars, no lesions, no masses. Normal hearing. Normal lips.  Musculoskeletal: Normal gait and station of head and neck.     Complexity of Data:  Records Review:   Previous Patient Records  Urine Test Review:   Urinalysis   11/28/23  Urinalysis  Urine Appearance Clear   Urine Color Yellow   Urine Glucose Neg mg/dL  Urine Bilirubin Neg mg/dL  Urine Ketones Neg mg/dL  Urine Specific Gravity 1.015   Urine Blood Neg ery/uL  Urine pH 5.5   Urine Protein Neg mg/dL  Urine Urobilinogen 0.2 mg/dL  Urine Nitrites Neg   Urine Leukocyte Esterase Neg leu/uL   PROCEDURES:          Urinalysis - 81003 Dipstick Dipstick Cont'd  Color: Yellow Bilirubin: Neg  Appearance: Clear Ketones: Neg  Specific Gravity: 1.015 Blood: Neg  pH: 5.5 Protein: Neg  Glucose: Neg Urobilinogen: 0.2    Nitrites: Neg    Leukocyte Esterase: Neg    Notes:      ASSESSMENT:      ICD-10 Details  1 GU:   Left renal neoplasm - I50.487    PLAN:  Schedule Return Visit/Planned Activity: Keep Scheduled Appointment - Schedule Surgery          Document Letter(s):  Created for Patient: Clinical Summary         Notes:   There are no changes in the patients history or physical exam since last evaluation by Dr. Selma. Pt is scheduled to undergo RAL left radical nephrectomy on 12/04/23.   All pt's questions were answered to the best of my ability.          Next Appointment:      Next Appointment: 12/04/2023 12:00 PM    Appointment Type: Surgery     Location: [facility] [phone]    Provider: Donnice Selma, M.D.    Reason for Visit: WL/OP RA LAP (L) RADICAL NEPHRECTOMY WITH [facility]    Urology Preoperative H&P   Chief Complaint: Left renal  mass  History of Present Illness: Kaitlyn Allen is a 78 y.o. female with a large left renal mass here for left robotic assisted laparoscopic radical nephrectomy. Denies fevers, chills, dysuria.    Past Medical History:  Diagnosis Date   Anemia    Cancer (HCC)    thyroid    CHF (congestive heart failure) (HCC)    Chronic kidney disease    Diabetes mellitus    Hyperlipidemia    Hypertension    Hypothyroidism    NSTEMI (non-ST elevated myocardial infarction) (HCC) 12/2010   pulmonary edema & NSTEMI post-thyroidectomy; 12/31/10 [facility]: Normal coronaries, moderate LV dysfunction, EF 40%   Osteopenia    Scleroderma (HCC)    Scoliosis    Takotsubo cardiomyopathy 12/2010   post-thyroidectomy   Thyroid  goiter    s/p thyroidectomy    Past Surgical History:  Procedure Laterality Date   BACK SURGERY     CARDIAC CATHETERIZATION  12/31/2010   Left ventricular angiography performed in the RAO view demonstrates  moderate-to-severe hypokinesis involving the mid-to-distal anterior  wall, distal inferior wall, and apex.  Overall, systolic function is  moderately reduced with ejection fraction estimated at 40%.    radioactiveiodine     thyrotoxicosis 1999   RIGHT HEART CATH N/A 03/12/2018   Procedure: RIGHT HEART CATH;  Surgeon: Cherrie Toribio SAUNDERS, MD;  Location: [facility]VAS[facility];  Service: Cardiovascular;  Laterality: N/A;   THYROIDECTOMY     12/28/2010   TONSILLECTOMY     TRANSTHORACIC ECHOCARDIOGRAM  12/30/2010   Left ventricle: LVEF is approximately 25 to 30% with hypokinesis  of the distal inferoseptal, mid/distal lateral, mid/distal   anterior, distal inferior, mid/distal mid/distal posterior and   apical walls   TRANSTHORACIC ECHOCARDIOGRAM  10/28/2010   Left ventricle: The cavity size was normal. There was mild concentric hypertrophy. Systolic function was lower limits of normal. The estimated ejection fraction was in the range of 50% to 55%.     Allergies:  Allergies  Allergen  Reactions   Ibandronate     Other Reaction(s): joint pains/weakness   Sulfa Drugs Cross Reactors Hives    Happened in childhood, believes they were all over the body.    Family History  Problem Relation Age of Onset   Breast cancer Mother 15   Cancer Mother        breast   Arthritis Sister    Heart failure Father     Social History:  reports that she has never smoked. She has never used smokeless tobacco. She reports that she does not drink alcohol and does not use drugs.  ROS: A complete review  of systems was performed.  All systems are negative except for pertinent findings as noted.  Physical Exam:  Vital signs in last 24 hours: Temp:  [98.1 F (36.7 C)] 98.1 F (36.7 C) (07/28 0944) Pulse Rate:  [86] 86 (07/28 0944) Resp:  [15] 15 (07/28 0944) BP: (137)/(76) 137/76 (07/28 0944) SpO2:  [98 %] 98 % (07/28 0944) Weight:  [56.6 kg] 56.6 kg (07/28 0943) Constitutional:  Alert and oriented, No acute distress Cardiovascular: Regular rate and rhythm Respiratory: Normal respiratory effort, Lungs clear bilaterally GI: Abdomen is soft, nontender, nondistended, no abdominal masses GU: No CVA tenderness Lymphatic: No lymphadenopathy Neurologic: Grossly intact, no focal deficits Psychiatric: Normal mood and affect  Laboratory Data:  No results for input(s): WBC, HGB, HCT, PLT in the last 72 hours.  No results for input(s): NA, K, CL, GLUCOSE, BUN, CALCIUM , CREATININE in the last 72 hours.  Invalid input(s): CO3   Results for orders placed or performed during the hospital encounter of 12/04/23 (from the past 24 hours)  Glucose, capillary     Status: Abnormal   Collection Time: 12/04/23  9:51 AM  Result Value Ref Range   Glucose-Capillary 119 (H) 70 - 99 mg/dL   Comment 1 Notify RN    Comment 2 Document in Chart    No results found for this or any previous visit (from the past 240 hours).  Renal Function: Recent Labs    11/28/23 1131   CREATININE 1.09*   Estimated Creatinine Clearance: 38 mL/min (A) (by C-G formula based on SCr of 1.09 mg/dL (H)).  Radiologic Imaging: No results found.  I independently reviewed the above imaging studies.  Assessment and Plan Kaitlyn Allen is a 78 y.o. female with a large left renal mass here for left robotic assisted laparoscopic radical nephrectomy.  We have discussed the risks of treatment in detail including but not limited to bleeding, infection, heart attack, stroke, death, venothromoboembolism, cancer recurrence, injury/damage to surrounding organs and structures, urine leak, the possibility of open surgical conversion for patients undergoing minimally invasive surgery, the risk of developing chronic kidney disease and its associated implications, and the potential risk of end stage renal disease possibly necessitating dialysis.   I have recommended proceeding with a left robot assisted laparoscopic radical nephrectomy. We did discuss the complexity of his tumor in the potential risk for necessitating an open surgical conversion if necessary.   Kaitlyn R. Jahaan Vanwagner MD 12/04/2023, 1:19 PM  [facility] Pager: [phone]): [phone]

## 2023-12-04 NOTE — Anesthesia Procedure Notes (Signed)
 Procedure Name: Intubation Date/Time: 12/04/2023 2:10 PM  Performed by: Buster Catheryn SAUNDERS, CRNAPre-anesthesia Checklist: Patient identified, Emergency Drugs available, Suction available and Patient being monitored Patient Re-evaluated:Patient Re-evaluated prior to induction Oxygen Delivery Method: Circle system utilized Preoxygenation: Pre-oxygenation with 100% oxygen Induction Type: IV induction Ventilation: Mask ventilation without difficulty Laryngoscope Size: Miller and 2 Grade View: Grade II Tube type: Oral Tube size: 7.0 mm Number of attempts: 1 Airway Equipment and Method: Stylet and Oral airway Placement Confirmation: ETT inserted through vocal cords under direct vision, positive ETCO2 and breath sounds checked- equal and bilateral Secured at: 22 cm Tube secured with: Tape Dental Injury: Teeth and Oropharynx as per pre-operative assessment

## 2023-12-04 NOTE — Op Note (Signed)
 Operative Note  Preoperative diagnosis:  1.  Left renal mass  Postoperative diagnosis: 1.  Left renal mass  Procedure(s): 1.  Robotic assisted laparoscopic left radical nephrectomy (adrenal sparing)  Surgeon: Donnice Siad, MD  Assistants:  Alan Hammonds, PA  An assistant was required for this surgical procedure.  The duties of the assistant included but were not limited to suctioning, passing suture, camera manipulation, retraction.  This procedure would not be able to be performed without an Geophysicist/field seismologist.   Resident: Jacqulyn Bound, PGY-4  Anesthesia:  General  Complications:  None  EBL:  50ml  Specimens: 1.  ID Type Source Tests Collected by Time Destination  1 : Left Kidney Tissue PATH GU biopsy SURGICAL PATHOLOGY Siad Donnice SAUNDERS, MD 12/04/2023 1455    Drains/Catheters: 1.  16 Fr foley catheter  Intraoperative findings:   Large left lower pole renal mass about 10cm with several lower pole accessory parasitic vessels. 1 main left renal artery and 1 left renal vein each taken with a stapler.  Indication:  Kaitlyn Allen is a 78 year old female with a left renal mass presenting for a robotic assisted laparoscopic left radical nephrectomy.  Description of procedure: The indications, alternatives, benefits and risks were discussed with the patient and informed consent was obtained.  The patient was brought onto the operating room table, positioned supine and secured to the bed with a safety strap.  All pressure points were carefully padded and pneumatic compression devices were placed on the lower extremities.  After the administration of intravenous antibiotics and general endotracheal anesthesia, a 16 French urethral catheter was inserted to drain the bladder.  The patient was repositioned in the right lateral decubitus with the left side elevated at a 70 degree angle with the right lower leg flexed and the left upper leg extended.  An axillary roll was positioned to protect the  brachial plexus and a gel pad was placed to support the back.  Multiple pillows were used to pad beneath and between the lower extremities and to ensure adequate cushioning. The left arm was placed in an armrest and carefully padded. The patient was secured in place across the hips, chest and legs with foam padding and silk tape, and the table was flexed.  The patient was prepped and draped in the standard sterile manner.  The radiographic images were in the room.  Timeout was completed verifying the correct patient, surgical procedure, site and positioning, prior to beginning the procedure.  Pneumoperitoneum was introduced by placing a Veress needle just lateral to the rectus belly into the abdomen and insufflating with CO2 to a pressure of 15 mmHg.  The camera trocar was placed approximately 7-1/2 cm inferior and 2 cm medially to the planned position of the left robotic arm which was approximately 2 fingerbreadths inferior to the costal margin.  The 0 degree lens was inserted under direct visualization.  The abdominal cavity was examined for any signs of injury, adhesions and identification of anatomic landmarks. We then placed our right robotic trocar, left robotic trocar and fourth arm trocar.  A 12 mm assistant port was placed periumbilically. The robot was then docked.   We surveyed the abdomen and readily apparent was a large left lower pole renal mass about 7 cm.  Of note, this was visualized through the mesentery of the bowel as the left colon was still adhered to the left sidewall. The white line of Toldt was incised and the colon was reflected medially, exposing Gerota's fascia.  Of  note, this took some time given the amount of reflection we had accomplished and or expose the large left lower pole renal mass.  The splenorenal nad splenophrenic ligaments were incised to mobilize the spleen. The tail of the pancreas was mobilized medially.  Great care was taken not to manipulate the pancreas.  I  dissected inferior to the left lower pole and was able to visualize the ureter and the gonadal vein.  There were several large parasitic vessels going to the kidney at this point.  I was able to achieve an adequate left using the fourth arm to elevate the large left lower pole renal mass.  I took these parasitic vessels with a combination of clips and bipolar electrocautery.  The retroperitoneal fascia overlying the renal vessels was carefully separated, exposing the underlying renal vein.  I was able to achieve an adequate lift using the fourth arm to elevate the kidney, further exposing the hilum.  I placed a couple of clips around a left lumbar vein further exposing the left renal vein.  There are some further parasitic vessels here that were taken with clips.  The left renal vein and left renal artery were carefully dissected and mobilized.  A laparoscopic battery-operated stapler with a vascular load was used to control and ligate the left renal artery after placing a Weck on the artery.  A separate vascular load was used to secure the left renal vein.  Next, the upper pole and any remaining attachments to the left kidney were mobilized and secured with bipolar cautery. Special attention was given to the cranial connections to the adrenal gland, which were carefully divided completely freeing the adrenal off the superior pole of the kidney.  The left ureter was divided between using the stapler.   The kidney was placed in an Endo Catch bag.  The renal fossa and remainder of the operating field were inspected for bleeding or injury.  The insufflation pressure was reduced, again confirming the absence of bleeding.  Excellent hemostasis was obtained.  Floseal was applied to the hilum and the edge of the adrenal gland.  A mini-Gibson incision was made at the site of the fourth arm trocar.  The fascia was opened and the specimen was removed in a muscle-sparing fashion.  The ports were removed under direct  vision.  Carter-Thomason device was used to close the 12 mm port.  All wounds were copiously irrigated and then infiltrated with Exparel .  The muscle was reapproximated using 0 Vicryl as a first layer. 1-0 PDS was used to close the fascia as a second layer.  The skin incisions were reapproximated with 4-0 Monocryl.  Dermabond was then applied on the skin.  At the end of the procedure, all counts were correct.  The patient tolerated the procedure well and was taken to the recovery room in satisfactory condition.  Matt R. Keiona Jenison MD [facility]  Pager: [phone]

## 2023-12-04 NOTE — Discharge Instructions (Signed)

## 2023-12-04 NOTE — Transfer of Care (Signed)
 Immediate Anesthesia Transfer of Care Note  Patient: Kaitlyn Allen  Procedure(s) Performed: NEPHRECTOMY, RADICAL, ROBOT-ASSISTED, LAPAROSCOPIC, ADULT (Left: Abdomen)  Patient Location: PACU  Anesthesia Type:General  Level of Consciousness: drowsy  Airway & Oxygen Therapy: Patient Spontanous Breathing and Patient connected to face mask oxygen  Post-op Assessment: Report given to RN and Post -op Vital signs reviewed and stable  Post vital signs: Reviewed and stable  Last Vitals:  Vitals Value Taken Time  BP 136/70 12/04/23 17:37  Temp    Pulse 76 12/04/23 17:44  Resp 21 12/04/23 17:45  SpO2 100 % 12/04/23 17:44  Vitals shown include unfiled device data.  Last Pain:  Vitals:   12/04/23 0944  TempSrc: Oral         Complications: No notable events documented.

## 2023-12-04 NOTE — Anesthesia Procedure Notes (Signed)
 Arterial Line Insertion Start/End7/28/2025 10:40 AM, 12/04/2023 10:47 AM Performed by: Maryclare Cornet, MD  Patient location: Pre-op. Preanesthetic checklist: patient identified, IV checked, site marked, risks and benefits discussed, surgical consent, monitors and equipment checked, pre-op evaluation, timeout performed and anesthesia consent Lidocaine  1% used for infiltration Right, radial was placed Catheter size: 20 G Hand hygiene performed  and maximum sterile barriers used   Attempts: 1 Procedure performed using ultrasound guided technique. Ultrasound Notes:anatomy identified, needle tip was noted to be adjacent to the nerve/plexus identified and no ultrasound evidence of intravascular and/or intraneural injection Following insertion, dressing applied and Biopatch. Post procedure assessment: normal and unchanged  Patient tolerated the procedure well with no immediate complications.

## 2023-12-05 ENCOUNTER — Other Ambulatory Visit: Payer: Self-pay

## 2023-12-05 ENCOUNTER — Encounter (HOSPITAL_COMMUNITY): Payer: Self-pay | Admitting: Urology

## 2023-12-05 LAB — BASIC METABOLIC PANEL WITH GFR
Anion gap: 12 (ref 5–15)
BUN: 34 mg/dL — ABNORMAL HIGH (ref 8–23)
CO2: 20 mmol/L — ABNORMAL LOW (ref 22–32)
Calcium: 7.9 mg/dL — ABNORMAL LOW (ref 8.9–10.3)
Chloride: 104 mmol/L (ref 98–111)
Creatinine, Ser: 1.86 mg/dL — ABNORMAL HIGH (ref 0.44–1.00)
GFR, Estimated: 27 mL/min — ABNORMAL LOW (ref >60)
Glucose, Bld: 132 mg/dL — ABNORMAL HIGH (ref 70–99)
Potassium: 4.1 mmol/L (ref 3.5–5.1)
Sodium: 136 mmol/L (ref 135–145)

## 2023-12-05 LAB — HEMOGLOBIN AND HEMATOCRIT, BLOOD
HCT: 29.1 % — ABNORMAL LOW (ref 36.0–46.0)
Hemoglobin: 9.2 g/dL — ABNORMAL LOW (ref 12.0–15.0)

## 2023-12-05 MED ORDER — DOCUSATE SODIUM 100 MG PO CAPS: 100.0000 mg | ORAL_CAPSULE | Freq: Every day | ORAL | 0 refills | Status: AC | PRN

## 2023-12-05 MED ORDER — SIMETHICONE 80 MG PO CHEW
40.0000 mg | CHEWABLE_TABLET | Freq: Four times a day (QID) | ORAL | Status: DC | PRN
  Administered 2023-12-05 (×2): 80 mg via ORAL
  Filled 2023-12-05 (×2): qty 1

## 2023-12-05 MED ORDER — OXYCODONE-ACETAMINOPHEN 5-325 MG PO TABS: 1.0000 | ORAL_TABLET | ORAL | 0 refills | Status: AC | PRN

## 2023-12-05 NOTE — Plan of Care (Signed)
  Problem: Clinical Measurements: Goal: Ability to maintain clinical measurements within normal limits will improve Outcome: Progressing Goal: Diagnostic test results will improve Outcome: Progressing Goal: Respiratory complications will improve Outcome: Progressing Goal: Cardiovascular complication will be avoided Outcome: Progressing   Problem: Elimination: Goal: Will not experience complications related to urinary retention Outcome: Progressing   Problem: Pain Managment: Goal: General experience of comfort will improve and/or be controlled Outcome: Progressing

## 2023-12-05 NOTE — Progress Notes (Signed)
 1 Day Post-Op Subjective: C/o intermitted RUQ pain, likely from retained CO2. Denies nausea, emesis. Tolerating some soft diet.  Objective: Vital signs in last 24 hours: Temp:  [96.7 F (35.9 C)-98.8 F (37.1 C)] 98.2 F (36.8 C) (07/29 1324) Pulse Rate:  [73-103] 80 (07/29 1324) Resp:  [12-21] 16 (07/29 1324) BP: (111-152)/(58-86) 141/68 (07/29 1324) SpO2:  [94 %-100 %] 98 % (07/29 1324) Arterial Line BP: (147-175)/(53-66) 165/60 (07/28 1830)  Intake/Output from previous day: 07/28 0701 - 07/29 0700 In: 2133.5 [I.V.:1533.5; IV Piggyback:600] Out: 500 [Urine:450; Blood:50] Intake/Output this shift: Total I/O In: -  Out: 100 [Other:100]  Physical Exam:  General: Alert and oriented CV: RRR Lungs: Clear Abdomen: Soft, ND, ATTP; inc c/d/I Ext: NT, No erythema  Lab Results: Recent Labs    12/04/23 1830 12/05/23 0720  HGB 9.1* 9.2*  HCT 29.4* 29.1*   BMET Recent Labs    12/05/23 0720  NA 136  K 4.1  CL 104  CO2 20*  GLUCOSE 132*  BUN 34*  CREATININE 1.86*  CALCIUM  7.9*     Studies/Results: No results found.  Assessment/Plan: POD1 from robotic assisted left laparoscopic radical nephrectomy  -Pain control prn. Added simethicone  for gas related pain. -Soft diet, advance as tolerated -Labs appropriate -OOB, amb, IS -Discharge home today if gas pain well controlled. Ok to stay another night if needed.   LOS: 1 day   Matt R. Wei Poplaski MD 12/05/2023, 2:28 PM [facility]  Pager: [phone]

## 2023-12-05 NOTE — Progress Notes (Signed)
   12/05/23 0854  TOC Brief Assessment  Insurance and Status Reviewed  Patient has primary care physician Yes  Home environment has been reviewed Resides alone in single family home  Prior level of function: Independent with ADLs at baseline  Prior/Current Home Services No current home services  Social Drivers of Health Review SDOH reviewed no interventions necessary  Readmission risk has been reviewed Yes  Transition of care needs no transition of care needs at this time

## 2023-12-05 NOTE — Discharge Summary (Signed)
 Date of admission: 12/04/2023  Date of discharge: 12/05/2023  Admission diagnosis: Left renal mass  Discharge diagnosis: Left renal mass  Secondary diagnoses:  Patient Active Problem List   Diagnosis Date Noted   Renal mass 12/04/2023   Lumbar scoliosis 04/24/2023   Thyroid  cancer, follicular variant of papillary carcinoma, T1a, Nx, Mx 01/26/2011   Takotsubo cardiomyopathy 01/07/2011   Glucose intolerance (impaired glucose tolerance) 01/07/2011   Anemia 01/07/2011    Procedures performed: Procedure(s): NEPHRECTOMY, RADICAL, ROBOT-ASSISTED, LAPAROSCOPIC, ADULT  History and Physical: For full details, please see admission history and physical. Briefly, Kaitlyn Allen is a 78 y.o. year old patient with large left renal mass.   Hospital Course: Patient tolerated the procedure well.  She was then transferred to the floor after an uneventful PACU stay.  Her hospital course was uncomplicated.  On POD#1 she had met discharge criteria: was eating a regular diet, was up and ambulating independently,  pain was well controlled, was voiding without a catheter, and was ready to for discharge.   Laboratory values:  Recent Labs    12/04/23 1830  HGB 9.1*  HCT 29.4*   No results for input(s): NA, K, CL, CO2, GLUCOSE, BUN, CREATININE, CALCIUM  in the last 72 hours. No results for input(s): LABPT, INR in the last 72 hours. No results for input(s): LABURIN in the last 72 hours. Results for orders placed or performed during the hospital encounter of 04/18/23  Surgical pcr screen     Status: Abnormal   Collection Time: 04/18/23  1:42 PM   Specimen: Nasal Mucosa; Nasal Swab  Result Value Ref Range Status   MRSA, PCR NEGATIVE NEGATIVE Final   Staphylococcus aureus POSITIVE (A) NEGATIVE Final    Comment: (NOTE) The Xpert SA Assay (FDA approved for NASAL specimens in patients 67 years of age and older), is one component of a comprehensive surveillance program. It is not  intended to diagnose infection nor to guide or monitor treatment. Performed at [facility], [address], [place], [place] [address]     Physical Exam  Gen: NAD Resp: Satting well on RA Card: Regular rate Abd: Soft, appropriately tender, ND, incision clean dry and intact GU: Voing spontaneously. Neuro: Alert   Disposition: Home  Discharge instruction: The patient was instructed to be ambulatory but told to refrain from heavy lifting, strenuous activity, or driving.   Discharge medications:  Allergies as of 12/05/2023       Reactions   Ibandronate    Other Reaction(s): joint pains/weakness   Sulfa Drugs Cross Reactors Hives   Happened in childhood, believes they were all over the body.        Medication List     STOP taking these medications    CALCIUM +D3 PO   Fish Oil 300 MG Caps   Magnesium 250 MG Tabs   MULTIVITAMIN GUMMIES ADULT PO   SUPER B COMPLEX PO   Vitamin D-3 25 MCG (1000 UT) Caps   vitamin E 180 MG (400 UNITS) capsule       TAKE these medications    atorvastatin  40 MG tablet Commonly known as: LIPITOR Take 40 mg by mouth daily.   enalapril  20 MG tablet Commonly known as: VASOTEC  Take 20 mg by mouth daily.   ferrous sulfate 325 (65 FE) MG tablet Take 325 mg by mouth daily.   hydrochlorothiazide  12.5 MG capsule Commonly known as: MICROZIDE  Take 12.5 mg by mouth daily.   levothyroxine  100 MCG tablet Commonly known as: SYNTHROID  Take 100  mcg by mouth daily before breakfast.   loratadine  10 MG tablet Commonly known as: CLARITIN  Take 10 mg by mouth daily as needed for allergies.        Followup:   Follow-up Information     Selma Donnice SAUNDERS, MD Follow up on 12/11/2023.   Specialty: Urology Why: at 10:45 Contact information: [address] [place] [address] [phone]

## 2023-12-06 ENCOUNTER — Encounter (HOSPITAL_COMMUNITY): Payer: Self-pay | Admitting: Urology

## 2023-12-06 NOTE — Anesthesia Postprocedure Evaluation (Signed)
 Anesthesia Post Note  Patient: Kaitlyn Allen  Procedure(s) Performed: NEPHRECTOMY, RADICAL, ROBOT-ASSISTED, LAPAROSCOPIC, ADULT (Left: Abdomen)     Patient location during evaluation: PACU Anesthesia Type: General Level of consciousness: awake and alert Pain management: pain level controlled Vital Signs Assessment: post-procedure vital signs reviewed and stable Respiratory status: spontaneous breathing, nonlabored ventilation, respiratory function stable and patient connected to nasal cannula oxygen Cardiovascular status: blood pressure returned to baseline and stable Postop Assessment: no apparent nausea or vomiting Anesthetic complications: no   No notable events documented.  Last Vitals:  Vitals:   12/05/23 0924 12/05/23 1324  BP: 122/69 (!) 141/68  Pulse: 81 80  Resp: 16 16  Temp: 36.6 C 36.8 C  SpO2: 97% 98%    Last Pain:  Vitals:   12/05/23 1324  TempSrc: Oral  PainSc:                  Annarose Ouellet S

## 2023-12-07 DIAGNOSIS — N183 Chronic kidney disease, stage 3 unspecified: Secondary | ICD-10-CM | POA: Diagnosis not present

## 2023-12-07 DIAGNOSIS — E785 Hyperlipidemia, unspecified: Secondary | ICD-10-CM | POA: Diagnosis not present

## 2023-12-07 DIAGNOSIS — E890 Postprocedural hypothyroidism: Secondary | ICD-10-CM | POA: Diagnosis not present

## 2023-12-07 DIAGNOSIS — E039 Hypothyroidism, unspecified: Secondary | ICD-10-CM | POA: Diagnosis not present

## 2023-12-08 LAB — SURGICAL PATHOLOGY

## 2023-12-11 DIAGNOSIS — D49512 Neoplasm of unspecified behavior of left kidney: Secondary | ICD-10-CM | POA: Diagnosis not present

## 2023-12-11 DIAGNOSIS — N189 Chronic kidney disease, unspecified: Secondary | ICD-10-CM | POA: Diagnosis not present

## 2023-12-21 DIAGNOSIS — C642 Malignant neoplasm of left kidney, except renal pelvis: Secondary | ICD-10-CM | POA: Insufficient documentation

## 2023-12-21 NOTE — Progress Notes (Unsigned)
 [facility] CONSULT NOTE  Patient Care Team: Teresa Channel, MD as PCP - General (Family Medicine)  ASSESSMENT & PLAN:  Kaitlyn Allen is a 78 y.o.female with history of HTN, scleroderma Raynaud's syndrome, hypothyroidism, CKD 3, NSTEMI, spinal fusion [facility] left chromophobe renal cell carcinoma.  Diagnosis: pT2b chromophobe RCC of left kidney.  There is no extensive family with malignancy.  Discussed there is no proven therapy in the adjuvant setting with certain advantage. Chromophobe subtype was not part of the Keynote 564 study. Single agent immunotherapy has poor response rate in stage IV study. At this time, recommend surveillance. If recurrence recommend biopsy and NGS testing.  She also has history of papillary thyroid  carcinoma. Assessment & Plan Primary renal cell carcinoma of left kidney [facility]) She has a pending CT to be scheduled with Dr. Selma Normocytic anemia Repeat CBC, check B12, folate, iron panel and ferritin today.  Orders Placed This Encounter  Procedures   CBC with Differential (Cancer Center Only)    Standing Status:   Future    Expiration Date:   12/24/2024   Folate    Standing Status:   Future    Expiration Date:   12/24/2024   Ferritin    Standing Status:   Future    Expiration Date:   12/24/2024   Iron and Iron Binding Capacity ([facility] only)    Standing Status:   Future    Expiration Date:   12/24/2024   Vitamin B12    Standing Status:   Future    Expiration Date:   12/24/2024    All questions were answered. The patient knows to call the clinic with any problems, questions or concerns. No barriers to learning was detected.  Pauletta JAYSON Chihuahua, MD 8/18/202512:04 PM  CHIEF COMPLAINTS/PURPOSE OF CONSULTATION:  Left RCC  HISTORY OF PRESENTING ILLNESS:  Kaitlyn Allen 78 y.o. female is here because of L RCC.  Report history of CKD and seen by nephrology for evaluation. US  was obtained and found the left renal  mass. She was then referred to Urology.  Clinically she is feeling well.  She denies any pain, or other symptoms related to her cancer.  She has recovered from surgery well including walking more than a mile a day.  She denies any new headaches, visual change, loss of appetite, unexpected weight loss, new cough or other systemic symptoms.  I have reviewed her chart and materials related to her cancer extensively and collaborated history with the patient. Summary of oncologic history is as follows: Oncology History  Thyroid  cancer, follicular variant of papillary carcinoma, T1a, Nx, Mx  01/26/2011 Initial Diagnosis   Thyroid  cancer, follicular variant of papillary carcinoma, T1a, Nx, Mx   Primary renal cell carcinoma of left kidney (HCC)  08/11/2023 Imaging   CT CAP 1. Exophytic heterogeneous solid enhancing 8.4 x 5.9 cm left lower pole neoplasm measures 8.4 x 5.9 cm is compatible with renal cell carcinoma. 2. No evidence of tumor in renal vein or pathologically enlarged abdominal lymph nodes although evaluation is slightly limited by streak artifact from posterior spinal fusion hardware. 3. No evidence of metastatic disease in the abdomen. 4. Small hiatal hernia. 5. Aortic atherosclerosis.   12/04/2023 Pathology Results   A. LEFT KIDNEY, RESECTION: Chromophobe renal cell carcinoma, size 8.7 cm Tumor is limited to the kidney (pT2b) Lymphovascular invasion is identified Ureteral, vascular and all margins of resection are negative for tumor  KIDNEY: Procedure: Nephrectomy Specimen Laterality: Left Tumor Focality  and Site: inferior pole Tumor Size: 8.7 cm Histologic Type: Chromophobe renal cell carcinoma Sarcomatoid Features: Negative Rhabdoid Features: Negative Histologic Grade (WHO/ISUP grade): NA (see comment) Tumor Necrosis: Negative Tumor Extension: limited to the kidney Lymphovascular invasion: Identified Margins: Negative Regional Lymph Nodes:      No lymph nodes submitted or  found: x      Number of Lymph Nodes Involved: NA      Number of Lymph Nodes Examined: NA Distant Metastasis: NA Pathologic Stage Classification (pTNM, AJCC 8th Edition):  pT2b, pN    12/21/2023 Initial Diagnosis   Primary renal cell carcinoma of left kidney [facility])     MEDICAL HISTORY:  Past Medical History:  Diagnosis Date   Anemia    Cancer (HCC)    thyroid   CHF (congestive heart failure) (HCC)    Chronic kidney disease    Diabetes mellitus    Hyperlipidemia    Hypertension    Hypothyroidism    NSTEMI (non-ST elevated myocardial infarction) (HCC) 12/2010   pulmonary edema & NSTEMI post-thyroidectomy; 12/31/10 [facility]: Normal coronaries, moderate LV dysfunction, EF 40%   Osteopenia    Scleroderma (HCC)    Scoliosis    Takotsubo cardiomyopathy 12/2010   post-thyroidectomy   Thyroid goiter    s/p thyroidectomy    SURGICAL HISTORY: Past Surgical History:  Procedure Laterality Date   BACK SURGERY     CARDIAC CATHETERIZATION  12/31/2010   Left ventricular angiography performed in the RAO view demonstrates  moderate-to-severe hypokinesis involving the mid-to-distal anterior  wall, distal inferior wall, and apex.  Overall, systolic function is  moderately reduced with ejection fraction estimated at 40%.    radioactiveiodine     thyrotoxicosis 1999   RIGHT HEART CATH N/A 03/12/2018   Procedure: RIGHT HEART CATH;  Surgeon: Cherrie Toribio SAUNDERS, MD;  Location: [facility];  Service: Cardiovascular;  Laterality: N/A;   ROBOT ASSISTED LAPAROSCOPIC NEPHRECTOMY Left 12/04/2023   Procedure: NEPHRECTOMY, RADICAL, ROBOT-ASSISTED, LAPAROSCOPIC, ADULT;  Surgeon: Selma Donnice SAUNDERS, MD;  Location: [facility];  Service: Urology;  Laterality: Left;   THYROIDECTOMY     12/28/2010   TONSILLECTOMY     TRANSTHORACIC ECHOCARDIOGRAM  12/30/2010   Left ventricle: LVEF is approximately 25 to 30% with hypokinesis  of the distal inferoseptal, mid/distal lateral, mid/distal   anterior, distal inferior,  mid/distal mid/distal posterior and   apical walls   TRANSTHORACIC ECHOCARDIOGRAM  10/28/2010   Left ventricle: The cavity size was normal. There was mild concentric hypertrophy. Systolic function was lower limits of normal. The estimated ejection fraction was in the range of 50% to 55%.     SOCIAL HISTORY: Social History   Socioeconomic History   Marital status: Divorced    Spouse name: Not on file   Number of children: Not on file   Years of education: Not on file   Highest education level: Not on file  Occupational History   Not on file  Tobacco Use   Smoking status: Never   Smokeless tobacco: Never  Vaping Use   Vaping status: Never Used  Substance and Sexual Activity   Alcohol use: No   Drug use: No   Sexual activity: Not on file  Other Topics Concern   Not on file  Social History Narrative   Not on file   Social Drivers of Health   Financial Resource Strain: Not on file  Food Insecurity: No Food Insecurity (12/25/2023)   Hunger Vital Sign    Worried About Running Out of  Food in the Last Year: Never true    Ran Out of Food in the Last Year: Never true  Transportation Needs: No Transportation Needs (12/25/2023)   PRAPARE - Administrator, Civil Service (Medical): No    Lack of Transportation (Non-Medical): No  Physical Activity: Not on file  Stress: Not on file  Social Connections: Moderately Integrated (12/05/2023)   Social Connection and Isolation Panel    Frequency of Communication with Friends and Family: More than three times a week    Frequency of Social Gatherings with Friends and Family: More than three times a week    Attends Religious Services: More than 4 times per year    Active Member of [employer] or Organizations: Yes    Attends Engineer, structural: More than 4 times per year    Marital Status: Divorced  Intimate Partner Violence: Not At Risk (12/25/2023)   Humiliation, Afraid, Rape, and Kick questionnaire    Fear of Current or  Ex-Partner: No    Emotionally Abused: No    Physically Abused: No    Sexually Abused: No    FAMILY HISTORY: Family History  Problem Relation Age of Onset   Breast cancer Mother 74   Cancer Mother        breast   Arthritis Sister    Heart failure Father     ALLERGIES:  is allergic to ibandronate and sulfa drugs cross reactors.  MEDICATIONS:  Current Outpatient Medications  Medication Sig Dispense Refill   atorvastatin (LIPITOR) 40 MG tablet Take 40 mg by mouth daily.     b complex vitamins capsule Take 1 capsule by mouth daily.     enalapril (VASOTEC) 20 MG tablet Take 20 mg by mouth daily.      ferrous sulfate 325 (65 FE) MG tablet Take 325 mg by mouth daily.     hydrochlorothiazide (MICROZIDE) 12.5 MG capsule Take 12.5 mg by mouth daily.     levothyroxine (SYNTHROID) 100 MCG tablet Take 100 mcg by mouth daily before breakfast.     loratadine (CLARITIN) 10 MG tablet Take 10 mg by mouth daily as needed for allergies.     vitamin E 180 MG (400 UNITS) capsule Take 400 Units by mouth daily.     docusate sodium (COLACE) 100 MG capsule Take 1 capsule (100 mg total) by mouth daily as needed for up to 30 doses. (Patient not taking: Reported on 12/25/2023) 30 capsule 0   oxyCODONE-acetaminophen (PERCOCET) 5-325 MG tablet Take 1 tablet by mouth every 4 (four) hours as needed for up to 18 doses for severe pain (pain score 7-10). (Patient not taking: Reported on 12/25/2023) 18 tablet 0   No current facility-administered medications for this visit.    REVIEW OF SYSTEMS:   All relevant systems were reviewed with the patient and are negative.  PHYSICAL EXAMINATION: ECOG PERFORMANCE STATUS: 0 - Asymptomatic  Vitals:   12/25/23 1127  BP: 125/69  Pulse: 83  Resp: 18  Temp: 98.1 F (36.7 C)  SpO2: 100%   Filed Weights   12/25/23 1127  Weight: 126 lb (57.2 kg)    GENERAL: alert, no distress and comfortable SKIN: skin color is normal, no jaundice EYES: sclera clear OROPHARYNX: no  exudate, no erythema NECK: supple LYMPH:  no palpable lymphadenopathy in the cervical regions LUNGS: Effort normal, no respiratory distress.  Clear to auscultation bilaterally HEART: regular rate & rhythm and no lower extremity edema ABDOMEN: soft, non-tender and nondistended Musculoskeletal: no point tenderness  NEURO: no focal motor/sensory deficits  LABORATORY DATA:  I have reviewed the data as listed Lab Results  Component Value Date   WBC 6.5 11/28/2023   HGB 9.2 (L) 12/05/2023   HCT 29.1 (L) 12/05/2023   MCV 90.4 11/28/2023   PLT 384 11/28/2023   Recent Labs    04/26/23 0845 11/28/23 1131 12/05/23 0720  NA 138 134* 136  K 3.7 4.2 4.1  CL 103 101 104  CO2 20* 23 20*  GLUCOSE 141* 96 132*  BUN 27* 34* 34*  CREATININE 1.36* 1.09* 1.86*  CALCIUM  7.7* 8.8* 7.9*  GFRNONAA 40* 52* 27*    RADIOGRAPHIC STUDIES: I have personally reviewed the radiological images as listed and agreed with the findings in the report.

## 2023-12-25 ENCOUNTER — Inpatient Hospital Stay

## 2023-12-25 VITALS — BP 125/69 | HR 83 | Temp 98.1°F | Resp 18 | Ht 65.0 in | Wt 126.0 lb

## 2023-12-25 DIAGNOSIS — I252 Old myocardial infarction: Secondary | ICD-10-CM | POA: Diagnosis not present

## 2023-12-25 DIAGNOSIS — N183 Chronic kidney disease, stage 3 unspecified: Secondary | ICD-10-CM | POA: Insufficient documentation

## 2023-12-25 DIAGNOSIS — E785 Hyperlipidemia, unspecified: Secondary | ICD-10-CM | POA: Diagnosis not present

## 2023-12-25 DIAGNOSIS — Z79899 Other long term (current) drug therapy: Secondary | ICD-10-CM | POA: Insufficient documentation

## 2023-12-25 DIAGNOSIS — D649 Anemia, unspecified: Secondary | ICD-10-CM | POA: Diagnosis not present

## 2023-12-25 DIAGNOSIS — E1122 Type 2 diabetes mellitus with diabetic chronic kidney disease: Secondary | ICD-10-CM | POA: Diagnosis not present

## 2023-12-25 DIAGNOSIS — C642 Malignant neoplasm of left kidney, except renal pelvis: Secondary | ICD-10-CM | POA: Diagnosis not present

## 2023-12-25 DIAGNOSIS — Z85850 Personal history of malignant neoplasm of thyroid: Secondary | ICD-10-CM | POA: Insufficient documentation

## 2023-12-25 DIAGNOSIS — I509 Heart failure, unspecified: Secondary | ICD-10-CM | POA: Diagnosis not present

## 2023-12-25 DIAGNOSIS — I130 Hypertensive heart and chronic kidney disease with heart failure and stage 1 through stage 4 chronic kidney disease, or unspecified chronic kidney disease: Secondary | ICD-10-CM | POA: Insufficient documentation

## 2023-12-25 DIAGNOSIS — K449 Diaphragmatic hernia without obstruction or gangrene: Secondary | ICD-10-CM | POA: Insufficient documentation

## 2023-12-25 LAB — CBC WITH DIFFERENTIAL (CANCER CENTER ONLY)
Abs Immature Granulocytes: 0.01 K/uL (ref 0.00–0.07)
Basophils Absolute: 0.1 K/uL (ref 0.0–0.1)
Basophils Relative: 1 %
Eosinophils Absolute: 0.2 K/uL (ref 0.0–0.5)
Eosinophils Relative: 4 %
HCT: 28.2 % — ABNORMAL LOW (ref 36.0–46.0)
Hemoglobin: 9.2 g/dL — ABNORMAL LOW (ref 12.0–15.0)
Immature Granulocytes: 0 %
Lymphocytes Relative: 21 %
Lymphs Abs: 1.3 K/uL (ref 0.7–4.0)
MCH: 28.7 pg (ref 26.0–34.0)
MCHC: 32.6 g/dL (ref 30.0–36.0)
MCV: 87.9 fL (ref 80.0–100.0)
Monocytes Absolute: 0.7 K/uL (ref 0.1–1.0)
Monocytes Relative: 10 %
Neutro Abs: 4.1 K/uL (ref 1.7–7.7)
Neutrophils Relative %: 64 %
Platelet Count: 362 K/uL (ref 150–400)
RBC: 3.21 MIL/uL — ABNORMAL LOW (ref 3.87–5.11)
RDW: 14.6 % (ref 11.5–15.5)
WBC Count: 6.4 K/uL (ref 4.0–10.5)
nRBC: 0 % (ref 0.0–0.2)

## 2023-12-25 LAB — IRON AND IRON BINDING CAPACITY (CC-WL,HP ONLY)
Iron: 43 ug/dL (ref 28–170)
Saturation Ratios: 11 % (ref 10.4–31.8)
TIBC: 379 ug/dL (ref 250–450)
UIBC: 336 ug/dL (ref 148–442)

## 2023-12-25 LAB — FOLATE: Folate: >40 ng/mL (ref >5.9)

## 2023-12-25 LAB — FERRITIN: Ferritin: 162 ng/mL (ref 11–307)

## 2023-12-25 LAB — VITAMIN B12: Vitamin B-12: 499 pg/mL (ref 180–914)

## 2023-12-25 NOTE — Assessment & Plan Note (Signed)
 She has a pending CT to be scheduled with Dr. Selma

## 2023-12-26 ENCOUNTER — Ambulatory Visit: Payer: Self-pay

## 2023-12-26 DIAGNOSIS — D509 Iron deficiency anemia, unspecified: Secondary | ICD-10-CM

## 2024-01-10 DIAGNOSIS — M5136 Other intervertebral disc degeneration, lumbar region with discogenic back pain only: Secondary | ICD-10-CM | POA: Diagnosis not present

## 2024-01-10 DIAGNOSIS — L03114 Cellulitis of left upper limb: Secondary | ICD-10-CM | POA: Diagnosis not present

## 2024-01-10 DIAGNOSIS — M8580 Other specified disorders of bone density and structure, unspecified site: Secondary | ICD-10-CM | POA: Diagnosis not present

## 2024-01-10 DIAGNOSIS — M353 Polymyalgia rheumatica: Secondary | ICD-10-CM | POA: Diagnosis not present

## 2024-01-10 DIAGNOSIS — M5431 Sciatica, right side: Secondary | ICD-10-CM | POA: Diagnosis not present

## 2024-01-10 DIAGNOSIS — Z6820 Body mass index (BMI) 20.0-20.9, adult: Secondary | ICD-10-CM | POA: Diagnosis not present

## 2024-01-10 DIAGNOSIS — R918 Other nonspecific abnormal finding of lung field: Secondary | ICD-10-CM | POA: Diagnosis not present

## 2024-01-10 DIAGNOSIS — I7300 Raynaud's syndrome without gangrene: Secondary | ICD-10-CM | POA: Diagnosis not present

## 2024-01-10 DIAGNOSIS — M340 Progressive systemic sclerosis: Secondary | ICD-10-CM | POA: Diagnosis not present

## 2024-01-22 DIAGNOSIS — N1832 Chronic kidney disease, stage 3b: Secondary | ICD-10-CM | POA: Diagnosis not present

## 2024-01-23 DIAGNOSIS — N1832 Chronic kidney disease, stage 3b: Secondary | ICD-10-CM | POA: Diagnosis not present

## 2024-01-24 ENCOUNTER — Other Ambulatory Visit: Payer: Self-pay | Admitting: Family Medicine

## 2024-01-24 DIAGNOSIS — Z1231 Encounter for screening mammogram for malignant neoplasm of breast: Secondary | ICD-10-CM

## 2024-01-31 DIAGNOSIS — I129 Hypertensive chronic kidney disease with stage 1 through stage 4 chronic kidney disease, or unspecified chronic kidney disease: Secondary | ICD-10-CM | POA: Diagnosis not present

## 2024-01-31 DIAGNOSIS — E785 Hyperlipidemia, unspecified: Secondary | ICD-10-CM | POA: Diagnosis not present

## 2024-01-31 DIAGNOSIS — N2889 Other specified disorders of kidney and ureter: Secondary | ICD-10-CM | POA: Diagnosis not present

## 2024-01-31 DIAGNOSIS — N1832 Chronic kidney disease, stage 3b: Secondary | ICD-10-CM | POA: Diagnosis not present

## 2024-01-31 DIAGNOSIS — M349 Systemic sclerosis, unspecified: Secondary | ICD-10-CM | POA: Diagnosis not present

## 2024-02-05 ENCOUNTER — Ambulatory Visit

## 2024-02-05 ENCOUNTER — Ambulatory Visit
Admission: RE | Admit: 2024-02-05 | Discharge: 2024-02-05 | Disposition: A | Source: Ambulatory Visit | Attending: Family Medicine | Admitting: Family Medicine

## 2024-02-05 DIAGNOSIS — Z1231 Encounter for screening mammogram for malignant neoplasm of breast: Secondary | ICD-10-CM | POA: Diagnosis not present

## 2024-02-27 DIAGNOSIS — M4316 Spondylolisthesis, lumbar region: Secondary | ICD-10-CM | POA: Diagnosis not present

## 2024-02-29 ENCOUNTER — Other Ambulatory Visit (HOSPITAL_BASED_OUTPATIENT_CLINIC_OR_DEPARTMENT_OTHER): Payer: Self-pay | Admitting: Family Medicine

## 2024-02-29 DIAGNOSIS — M810 Age-related osteoporosis without current pathological fracture: Secondary | ICD-10-CM | POA: Diagnosis not present

## 2024-02-29 DIAGNOSIS — Z85528 Personal history of other malignant neoplasm of kidney: Secondary | ICD-10-CM | POA: Diagnosis not present

## 2024-02-29 DIAGNOSIS — I129 Hypertensive chronic kidney disease with stage 1 through stage 4 chronic kidney disease, or unspecified chronic kidney disease: Secondary | ICD-10-CM | POA: Diagnosis not present

## 2024-02-29 DIAGNOSIS — Z1331 Encounter for screening for depression: Secondary | ICD-10-CM | POA: Diagnosis not present

## 2024-02-29 DIAGNOSIS — Z0000 Encounter for general adult medical examination without abnormal findings: Secondary | ICD-10-CM | POA: Diagnosis not present

## 2024-02-29 DIAGNOSIS — E785 Hyperlipidemia, unspecified: Secondary | ICD-10-CM | POA: Diagnosis not present

## 2024-02-29 DIAGNOSIS — I5181 Takotsubo syndrome: Secondary | ICD-10-CM | POA: Diagnosis not present

## 2024-02-29 DIAGNOSIS — E890 Postprocedural hypothyroidism: Secondary | ICD-10-CM | POA: Diagnosis not present

## 2024-02-29 DIAGNOSIS — R7303 Prediabetes: Secondary | ICD-10-CM | POA: Diagnosis not present

## 2024-02-29 DIAGNOSIS — Z23 Encounter for immunization: Secondary | ICD-10-CM | POA: Diagnosis not present

## 2024-02-29 DIAGNOSIS — N183 Chronic kidney disease, stage 3 unspecified: Secondary | ICD-10-CM | POA: Diagnosis not present

## 2024-02-29 DIAGNOSIS — D649 Anemia, unspecified: Secondary | ICD-10-CM | POA: Diagnosis not present

## 2024-03-15 DIAGNOSIS — I700 Atherosclerosis of aorta: Secondary | ICD-10-CM | POA: Diagnosis not present

## 2024-03-15 DIAGNOSIS — Z905 Acquired absence of kidney: Secondary | ICD-10-CM | POA: Diagnosis not present

## 2024-03-15 DIAGNOSIS — D49512 Neoplasm of unspecified behavior of left kidney: Secondary | ICD-10-CM | POA: Diagnosis not present

## 2024-06-10 ENCOUNTER — Other Ambulatory Visit (HOSPITAL_BASED_OUTPATIENT_CLINIC_OR_DEPARTMENT_OTHER)

## 2024-07-08 ENCOUNTER — Other Ambulatory Visit (HOSPITAL_BASED_OUTPATIENT_CLINIC_OR_DEPARTMENT_OTHER)
